# Patient Record
Sex: Female | Born: 1995 | Race: White | Hispanic: No | Smoking: Never smoker
Health system: Southern US, Community
[De-identification: ages and names within clinical notes are randomized; demographics above are authoritative.]

## PROBLEM LIST (undated history)

## (undated) DIAGNOSIS — E669 Obesity, unspecified: Secondary | ICD-10-CM

## (undated) DIAGNOSIS — E119 Type 2 diabetes mellitus without complications: Secondary | ICD-10-CM

## (undated) DIAGNOSIS — G43909 Migraine, unspecified, not intractable, without status migrainosus: Secondary | ICD-10-CM

## (undated) DIAGNOSIS — F99 Mental disorder, not otherwise specified: Secondary | ICD-10-CM

## (undated) DIAGNOSIS — R932 Abnormal findings on diagnostic imaging of liver and biliary tract: Secondary | ICD-10-CM

## (undated) DIAGNOSIS — E282 Polycystic ovarian syndrome: Secondary | ICD-10-CM

## (undated) HISTORY — DX: Abnormal findings on diagnostic imaging of liver and biliary tract: R93.2

## (undated) HISTORY — DX: Obesity, unspecified: E66.9

## (undated) HISTORY — DX: Polycystic ovarian syndrome: E28.2

## (undated) HISTORY — PX: TONSILLECTOMY: SUR1361

## (undated) HISTORY — PX: ADENOIDECTOMY: SUR15

## (undated) HISTORY — DX: Type 2 diabetes mellitus without complications: E11.9

## (undated) HISTORY — PX: FOOT SURGERY: SHX648

## (undated) HISTORY — DX: Migraine, unspecified, not intractable, without status migrainosus: G43.909

## (undated) HISTORY — DX: Mental disorder, not otherwise specified: F99

---
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
## 2014-06-09 ENCOUNTER — Ambulatory Visit (INDEPENDENT_AMBULATORY_CARE_PROVIDER_SITE_OTHER): Payer: Medicaid Other | Admitting: Women's Health

## 2014-06-09 ENCOUNTER — Encounter: Payer: Self-pay | Admitting: Women's Health

## 2014-06-09 VITALS — BP 136/70 | HR 100 | Wt 310.0 lb

## 2014-06-09 DIAGNOSIS — Z3202 Encounter for pregnancy test, result negative: Secondary | ICD-10-CM

## 2014-06-09 DIAGNOSIS — L298 Other pruritus: Secondary | ICD-10-CM

## 2014-06-09 DIAGNOSIS — N926 Irregular menstruation, unspecified: Secondary | ICD-10-CM

## 2014-06-09 DIAGNOSIS — L732 Hidradenitis suppurativa: Secondary | ICD-10-CM

## 2014-06-09 DIAGNOSIS — N898 Other specified noninflammatory disorders of vagina: Secondary | ICD-10-CM

## 2014-06-09 LAB — POCT WET PREP (WET MOUNT): Clue Cells Wet Prep Whiff POC: NEGATIVE

## 2014-06-09 LAB — POCT URINE PREGNANCY: PREG TEST UR: NEGATIVE

## 2014-06-09 MED ORDER — FLUCONAZOLE 150 MG PO TABS: 150.0000 mg | ORAL_TABLET | Freq: Once | ORAL | Status: DC

## 2014-06-09 NOTE — Patient Instructions (Addendum)
Dr. Margo AyeHall, [facility], Dr. Lodema HongSimpson  Hidradenitis . Loose, light clothing. Avoid heat, friction, shearing. Don't squeeze! . Wash clothes in perfume/dye free detergent . Gentle non-soap cleanser, wash gently w/ fingers (No cloth, loofah), can use antibacterial cleanser . Weight loss . Decrease/no dairy Hidradenitis Suppurativa, Sweat Gland Abscess Hidradenitis suppurativa is a long lasting (chronic), uncommon disease of the sweat glands. With this, boil-like lumps and scarring develop in the groin, some times under the arms (axillae), and under the breasts. It may also uncommonly occur behind the ears, in the crease of the buttocks, and around the genitals.  CAUSES  The cause is from a blocking of the sweat glands. They then become infected. It may cause drainage and odor. It is not contagious. So it cannot be given to someone else. It most often shows up in puberty (about 5910 to 20 years of age). But it may happen much later. It is similar to acne which is a disease of the sweat glands. This condition is slightly more common in African-Americans and women. SYMPTOMS   Hidradenitis usually starts as one or more red, tender, swellings in the groin or under the arms (axilla).  Over a period of hours to days the lesions get larger. They often open to the skin surface, draining clear to yellow-colored fluid.  The infected area heals with scarring. DIAGNOSIS  Your caregiver makes this diagnosis by looking at you. Sometimes cultures (growing germs on plates in the lab) may be taken. This is to see what germ (bacterium) is causing the infection.  TREATMENT   Topical germ killing medicine applied to the skin (antibiotics) are the treatment of choice. Antibiotics taken by mouth (systemic) are sometimes needed when the condition is getting worse or is severe.  Avoid tight-fitting clothing which traps moisture in.  Dirt does not cause hidradenitis and it is not caused by poor  hygiene.  Involved areas should be cleaned daily using an antibacterial soap. Some patients find that the liquid form of Lever 2000, applied to the involved areas as a lotion after bathing, can help reduce the odor related to this condition.  Sometimes surgery is needed to drain infected areas or remove scarred tissue. Removal of large amounts of tissue is used only in severe cases.  Birth control pills may be helpful.  Oral retinoids (vitamin A derivatives) for 6 to 12 months which are effective for acne may also help this condition.  Weight loss will improve but not cure hidradenitis. It is made worse by being overweight. But the condition is not caused by being overweight.  This condition is more common in people who have had acne.  It may become worse under stress. There is no medical cure for hidradenitis. It can be controlled, but not cured. The condition usually continues for years with periods of getting worse and getting better (remission). Document Released: 09/29/2003 Document Revised: 05/09/2011 Document Reviewed: 05/17/2013 [facility] Patient Information 2015 [place], [place]. This information is not intended to replace advice given to you by your health care provider. Make sure you discuss any questions you have with your health care provider.

## 2014-06-09 NOTE — Progress Notes (Signed)
Patient ID: Megan Blackwell Melgoza, female   DOB: 31-Jan-1996, 19 y.o.   MRN: [id number]   [facility]  Patient name: Megan Blackwell Carlton MRN [id number]  Date of birth: 31-Jan-1996  CC & HPI:  Megan Blackwell Grudzinski is a 19 y.o. Caucasian female presenting today for f/u COCs. She's on Sprintec for PCOS/period management and states she is doing great, has regular periods monthly, moods are much better than when she was on LoLoestrin. Was a couple of days late w/ period starting on last pack and it only last 1 day. Does have new sex partner, not using condoms. Did miss a pill, took 2 the next day. Cloudy nonodorous d/c x few months (before new sex partner), w/ vaginal itching. Needs new PCP as her was only a ped.   Pertinent History Reviewed:  Medical & Surgical Hx:   Past Medical History  Diagnosis Date  . Asthma   . Abnormal liver ultrasound   . Obesity    Past Surgical History  Procedure Laterality Date  . Foot surgery    . Tonsillectomy    . Adenoidectomy     Medications: Reviewed & Updated - see associated section Social History: Reviewed -  reports that she has been passively smoking.  She has never used smokeless tobacco.  Objective Findings:  Vitals: BP 136/70 mmHg  Pulse 100  Wt 310 lb (140.615 kg)  LMP 04/30/2014  Physical Examination: General appearance - alert, well appearing, and in no distress Pelvic - normal external genitalia, normal vagina and cervix. Scant amount thin white nonodorous d/c Extremities: hidradenitis bilateral thighs- pt states she also has under bilateral axilla  Wet prep: few yeast UPT: neg  Assessment & Plan:  A:   COC f/u  PCOS  Vaginal yeast  Hidradenitis P:  Continue sprintec  Will check gc/ct from urine  Rx diflucan for yeast/vaginitis   F/U 2859yr for physical, start paps at 20yo  Gave info on hidradenitis  Gave info on local PCPs    Marge DuncansBooker, Kimberly Randall CNM, [facility]WHNP-BC 06/09/2014 10:35 AM

## 2014-06-11 LAB — GC/CHLAMYDIA PROBE AMP
Chlamydia trachomatis, NAA: NEGATIVE
Neisseria gonorrhoeae by PCR: NEGATIVE

## 2014-07-01 ENCOUNTER — Encounter (HOSPITAL_COMMUNITY): Payer: Self-pay

## 2014-07-01 ENCOUNTER — Emergency Department (HOSPITAL_COMMUNITY)
Admission: EM | Admit: 2014-07-01 | Discharge: 2014-07-01 | Disposition: A | Discharge status: Home / Self Care | Payer: Medicaid Other | Attending: Emergency Medicine | Admitting: Emergency Medicine

## 2014-07-01 DIAGNOSIS — R591 Generalized enlarged lymph nodes: Secondary | ICD-10-CM | POA: Diagnosis not present

## 2014-07-01 DIAGNOSIS — H9209 Otalgia, unspecified ear: Secondary | ICD-10-CM | POA: Diagnosis not present

## 2014-07-01 DIAGNOSIS — E669 Obesity, unspecified: Secondary | ICD-10-CM | POA: Diagnosis not present

## 2014-07-01 DIAGNOSIS — J45909 Unspecified asthma, uncomplicated: Secondary | ICD-10-CM | POA: Insufficient documentation

## 2014-07-01 DIAGNOSIS — Z79899 Other long term (current) drug therapy: Secondary | ICD-10-CM | POA: Insufficient documentation

## 2014-07-01 DIAGNOSIS — M542 Cervicalgia: Secondary | ICD-10-CM | POA: Diagnosis present

## 2014-07-01 DIAGNOSIS — J029 Acute pharyngitis, unspecified: Secondary | ICD-10-CM | POA: Insufficient documentation

## 2014-07-01 LAB — RAPID STREP SCREEN (MED CTR MEBANE ONLY): Streptococcus, Group A Screen (Direct): NEGATIVE

## 2014-07-01 MED ORDER — ACETAMINOPHEN 500 MG PO TABS
1000.0000 mg | ORAL_TABLET | Freq: Once | ORAL | Status: AC
  Administered 2014-07-01: 1000 mg via ORAL
  Filled 2014-07-01: qty 2

## 2014-07-01 NOTE — Discharge Instructions (Signed)
Your strep test is negative. Please wash hands frequently. Increase fluids. Salt water gargles will be helpful. Use Chloraseptic spray before eating. Use 600mg  of ibuprofen every 6 hours for aching and soreness. See your Medicaid Access MD for follow up or recheck if not improving. Pharyngitis Pharyngitis is a sore throat (pharynx). There is redness, pain, and swelling of your throat. HOME CARE   Drink enough fluids to keep your pee (urine) clear or pale yellow.  Only take medicine as told by your doctor.  You may get sick again if you do not take medicine as told. Finish your medicines, even if you start to feel better.  Do not take aspirin.  Rest.  Rinse your mouth (gargle) with salt water ( tsp of salt per 1 qt of water) every 1-2 hours. This will help the pain.  If you are not at risk for choking, you can suck on hard candy or sore throat lozenges. GET HELP IF:  You have large, tender lumps on your neck.  You have a rash.  You cough up green, yellow-brown, or bloody spit. GET HELP RIGHT AWAY IF:   You have a stiff neck.  You drool or cannot swallow liquids.  You throw up (vomit) or are not able to keep medicine or liquids down.  You have very bad pain that does not go away with medicine.  You have problems breathing (not from a stuffy nose). MAKE SURE YOU:   Understand these instructions.  Will watch your condition.  Will get help right away if you are not doing well or get worse. Document Released: 08/03/2007 Document Revised: 12/05/2012 Document Reviewed: 10/22/2012 [facility] Patient Information 2015 [place]Care, [place]LLC. This information is not intended to replace advice given to you by your health care provider. Make sure you discuss any questions you have with your health care provider.  Salt Water Gargle This solution will help make your mouth and throat feel better. HOME CARE INSTRUCTIONS   Mix 1 teaspoon of salt in 8 ounces of warm water.  Gargle with this  solution as much or often as you need or as directed. Swish and gargle gently if you have any sores or wounds in your mouth.  Do not swallow this mixture. Document Released: 11/19/2003 Document Revised: 05/09/2011 Document Reviewed: 04/11/2008 [facility] Patient Information 2015 [place]ExitCare, [place]LLC. This information is not intended to replace advice given to you by your health care provider. Make sure you discuss any questions you have with your health care provider.

## 2014-07-01 NOTE — ED Notes (Signed)
Pt reports pain in left side of neck x 1 week.  Pts says hurts to swallow and move in certain ways.  Denies fever.

## 2014-07-01 NOTE — ED Provider Notes (Signed)
CSN: [id number]     Arrival date & time 07/01/14  1507 History   First MD Initiated Contact with Patient 07/01/14 1612     Chief Complaint  Patient presents with  . Neck Pain     (Consider location/radiation/quality/duration/timing/severity/associated sxs/prior Treatment) HPI Comments: 19 year old female with obesity PCO S history presents with sore throat and left-sided neck pain anterior discomfort for the past week. Worse or swelling. No dental pain. Patient tolerating oral, no fevers. Pain with palpation left side of anterior neck. No neck stiffness.  Patient is a 19 y.o. female presenting with neck pain. The history is provided by the patient.  Neck Pain Associated symptoms: no fever and no headaches     Past Medical History  Diagnosis Date  . Asthma   . Abnormal liver ultrasound   . Obesity    Past Surgical History  Procedure Laterality Date  . Foot surgery    . Tonsillectomy    . Adenoidectomy     Family History  Problem Relation Age of Onset  . Osteoporosis Mother   . Diabetes Maternal Grandmother   . Diabetes Maternal Grandfather    History  Substance Use Topics  . Smoking status: Passive Smoke Exposure - Never Smoker  . Smokeless tobacco: Never Used  . Alcohol Use: No   OB History    No data available     Review of Systems  Constitutional: Negative for fever and chills.  HENT: Positive for ear pain and sore throat.   Musculoskeletal: Positive for neck pain.  Neurological: Negative for headaches.      Allergies  Review of patient's allergies indicates no known allergies.  Home Medications   Prior to Admission medications   Medication Sig Start Date End Date Taking? Authorizing Provider  aspirin-acetaminophen-caffeine (EXCEDRIN MIGRAINE) [phone] MG per tablet Take 2 tablets by mouth every 6 (six) hours as needed for headache.   Yes Historical Provider, MD  Melatonin 5 MG TABS Take 10 mg by mouth at bedtime.    Yes Historical Provider, MD   norgestimate-ethinyl estradiol (SPRINTEC 28) 0.25-35 MG-MCG tablet Take 1 tablet by mouth daily. 10/09/13  Yes Cheral Marker, CNM  fluconazole (DIFLUCAN) 150 MG tablet Take 1 tablet (150 mg total) by mouth once. Take 1 pill now, may take 2nd pill in 3 days if needed Patient not taking: Reported on 07/01/2014 06/09/14   Cheral Marker, CNM  tobramycin (TOBREX) 0.3 % ophthalmic solution Place 2 drops into the left eye every 4 (four) hours. Patient not taking: Reported on 06/09/2014 12/24/13   Elson Areas, PA-C   BP 102/61 mmHg  Pulse 103  Temp(Src) 98.5 F (36.9 C) (Oral)  Resp 16  Ht 5' 4.5" (1.638 m)  Wt 302 lb 14.4 oz (137.395 kg)  BMI 51.21 kg/m2  SpO2 98%  LMP 06/26/2014 Physical Exam  Constitutional: She appears well-developed and well-nourished. No distress.  HENT:  Head: Normocephalic and atraumatic.  Patient has tender anterior cervical adenopathy bilateral worse in the left, no meningismus. No mild fluid behind left TM no drainage. trismus, uvular deviation, unilateral posterior pharyngeal edema or submandibular swelling.   Neck: Normal range of motion. Neck supple.  Nursing note and vitals reviewed.   ED Course  Procedures (including critical care time) Labs Review Labs Reviewed  RAPID STREP SCREEN  CULTURE, GROUP A STREP    Imaging Review No results found.   EKG Interpretation None      MDM   Final diagnoses:  Pharyngitis  Lymphadenopathy  Well-appearing female with cervical adenopathy and sore throat. Strep test pending. Tylenol for pain. Discussed supportive care and reasons to return. No red flags or emergent signs of infection.    Results and differential diagnosis were discussed with the patient/parent/guardian. Close follow up outpatient was discussed, comfortable with the plan.   Medications  acetaminophen (TYLENOL) tablet 1,000 mg (1,000 mg Oral Given 07/01/14 1638)    Filed Vitals:   07/01/14 1522 07/01/14 1753  BP: 127/67 102/61   Pulse: 112 103  Temp: 98.8 F (37.1 C) 98.5 F (36.9 C)  TempSrc: Oral Oral  Resp: 16 16  Height: 5' 4.5" (1.638 m)   Weight: 302 lb 14.4 oz (137.395 kg)   SpO2: 100% 98%    Final diagnoses:  Pharyngitis  Lymphadenopathy        Blane OharaJoshua Xzavian Semmel, MD 07/02/14 [phone]

## 2014-07-03 LAB — CULTURE, GROUP A STREP: STREP A CULTURE: POSITIVE — AB

## 2014-07-04 ENCOUNTER — Telehealth (HOSPITAL_BASED_OUTPATIENT_CLINIC_OR_DEPARTMENT_OTHER): Payer: Self-pay | Admitting: Emergency Medicine

## 2014-07-04 NOTE — Telephone Encounter (Signed)
Post ED Visit - Positive Culture Follow-up: Successful Patient Follow-Up  Culture assessed and recommendations reviewed by: []  Wes Dulaney, Pharm.D., BCPS [x]  Celedonio MiyamotoJeremy Frens, Pharm.D., BCPS []  Georgina PillionElizabeth Martin, [address] BCPS []  AvalonMinh Pham, [address]Pharm[address]D., BCPS, AAHIVP []  Estella HuskMichelle Turner, Pharm.D., BCPS, AAHIVP []  Red ChristiansSamson Lee, Pharm.D. []  Tennis Mustassie Stewart, Pharm.D.  Positive strep culture  [x]  Patient discharged without antimicrobial prescription and treatment is now indicated []  Organism is resistant to prescribed ED discharge antimicrobial []  Patient with positive blood cultures  Changes discussed with ED provider: Renne CriglerJoshua Geiple PA New antibiotic prescription Amoxicillin 500mg  po bid x 10 days Called to [facility] [place] [phone]  Contacted patient, 07/04/14 1039   Berle MullMiller, Hortencia Martire 07/04/2014, 10:38 AM

## 2014-07-04 NOTE — Progress Notes (Signed)
ED Antimicrobial Stewardship Positive Culture Follow Up   Megan Blackwell is an 19 y.o. female [facility] with a chief complaint of sore throat, ear pain, and neck pain.  Pt was afebrile, rapid strep test negative.  Pt was discharged w/ instructions to use acetaminophen for pain.  Culture now positive for Group A strep.  Chief Complaint  Patient presents with  . Neck Pain    Recent Results (from the past 720 hour(s))  GC/Chlamydia Probe Amp     Status: None   Collection Time: 06/09/14  3:09 PM  Result Value Ref Range Status   Chlamydia trachomatis, NAA Negative Negative Final   Neisseria gonorrhoeae by PCR Negative Negative Final   PLEASE NOTE: Comment  Final    Comment: Acceptable specimens for this test are female urethral swab, endocervical swab and liquid based pap specimens, vaginal swabs in APTIMA transports and first void urine. See online Directory of Services for test number for rectal and pharyngeal specimens.   Rapid strep screen     Status: None   Collection Time: 07/01/14  4:32 PM  Result Value Ref Range Status   Streptococcus, Group A Screen (Direct) NEGATIVE NEGATIVE Final    Comment: (NOTE) A Rapid Antigen test may result negative if the antigen level in the sample is below the detection level of this test. The FDA has not cleared this test as a stand-alone test therefore the rapid antigen negative result has reflexed to a Group A Strep culture.   Culture, Group A Strep     Status: Abnormal   Collection Time: 07/01/14  4:32 PM  Result Value Ref Range Status   Strep A Culture Positive (A)  Corrected    Comment: (NOTE) Penicillin and ampicillin are drugs of choice for treatment of beta-hemolytic streptococcal infections. Susceptibility testing of penicillins and other beta-lactam agents approved by the FDA for treatment of beta-hemolytic streptococcal infections need not be performed routinely because nonsusceptible isolates are  extremely rare in any beta-hemolytic streptococcus and have not been reported for Streptococcus pyogenes (group A). (CLSI 2011) Performed At: [facility] [address] [place], [place] [id number] Mila HomerHancock William F MD WU:[id number]:[phone] CORRECTED ON 05/05 AT 1341: PREVIOUSLY REPORTED AS Comment     [x]  Patient discharged originally without antimicrobial agent and treatment is now indicated  New antibiotic prescription: Amoxicillin 500mg  po BID x 10 days  ED Provider: Renne CriglerJoshua Geiple, PA-C   Megan Blackwell Kirtland BouchardK 07/04/2014, 9:33 AM Infectious Diseases Pharmacist Phone# [phone]

## 2014-09-19 ENCOUNTER — Telehealth: Payer: Self-pay | Admitting: Women's Health

## 2014-09-19 ENCOUNTER — Other Ambulatory Visit: Payer: Self-pay | Admitting: Women's Health

## 2014-09-22 NOTE — Telephone Encounter (Signed)
Pt needed to get a refill on her birth control. Pt's mother advised to call pharmacy later this afternoon. Pt's mother verbalized understanding. Joellyn Haff sent Rx refill to pt's pharmacy.

## 2014-12-22 ENCOUNTER — Emergency Department (HOSPITAL_COMMUNITY)
Admission: EM | Admit: 2014-12-22 | Discharge: 2014-12-22 | Disposition: A | Discharge status: Home / Self Care | Payer: Medicaid Other | Attending: Emergency Medicine | Admitting: Emergency Medicine

## 2014-12-22 ENCOUNTER — Encounter (HOSPITAL_COMMUNITY): Payer: Self-pay | Admitting: Emergency Medicine

## 2014-12-22 DIAGNOSIS — J45909 Unspecified asthma, uncomplicated: Secondary | ICD-10-CM | POA: Insufficient documentation

## 2014-12-22 DIAGNOSIS — Z3202 Encounter for pregnancy test, result negative: Secondary | ICD-10-CM | POA: Diagnosis not present

## 2014-12-22 DIAGNOSIS — E669 Obesity, unspecified: Secondary | ICD-10-CM | POA: Insufficient documentation

## 2014-12-22 DIAGNOSIS — Z79899 Other long term (current) drug therapy: Secondary | ICD-10-CM | POA: Diagnosis not present

## 2014-12-22 DIAGNOSIS — R112 Nausea with vomiting, unspecified: Secondary | ICD-10-CM | POA: Insufficient documentation

## 2014-12-22 DIAGNOSIS — R05 Cough: Secondary | ICD-10-CM | POA: Insufficient documentation

## 2014-12-22 DIAGNOSIS — N390 Urinary tract infection, site not specified: Secondary | ICD-10-CM

## 2014-12-22 DIAGNOSIS — R197 Diarrhea, unspecified: Secondary | ICD-10-CM | POA: Diagnosis not present

## 2014-12-22 LAB — URINALYSIS, ROUTINE W REFLEX MICROSCOPIC
Glucose, UA: NEGATIVE mg/dL
KETONES UR: NEGATIVE mg/dL
NITRITE: NEGATIVE
Protein, ur: 100 mg/dL — AB
UROBILINOGEN UA: 1 mg/dL (ref 0.0–1.0)
pH: 5.5 (ref 5.0–8.0)

## 2014-12-22 LAB — POC URINE PREG, ED: Preg Test, Ur: NEGATIVE

## 2014-12-22 LAB — URINE MICROSCOPIC-ADD ON

## 2014-12-22 MED ORDER — ONDANSETRON 8 MG PO TBDP: ORAL_TABLET | ORAL | Status: DC

## 2014-12-22 MED ORDER — CEPHALEXIN 500 MG PO CAPS: 500.0000 mg | ORAL_CAPSULE | Freq: Four times a day (QID) | ORAL | Status: DC

## 2014-12-22 MED ORDER — ONDANSETRON 8 MG PO TBDP
8.0000 mg | ORAL_TABLET | Freq: Once | ORAL | Status: AC
  Administered 2014-12-22: 8 mg via ORAL
  Filled 2014-12-22: qty 1

## 2014-12-22 MED ORDER — IBUPROFEN 800 MG PO TABS
800.0000 mg | ORAL_TABLET | Freq: Once | ORAL | Status: AC
  Administered 2014-12-22: 800 mg via ORAL
  Filled 2014-12-22: qty 1

## 2014-12-22 NOTE — ED Provider Notes (Signed)
CSN: [id number]     Arrival date & time 12/22/14  1653 History   First MD Initiated Contact with Patient 12/22/14 1715     Chief Complaint  Patient presents with  . Emesis     Patient is a 19 y.o. female presenting with vomiting. The history is provided by the patient.  Emesis Severity:  Moderate Duration:  7 days Timing:  Intermittent Progression:  Improving Chronicity:  New Relieved by:  Nothing Worsened by:  Nothing tried Associated symptoms: chills and diarrhea   Risk factors: no travel to endemic areas   pt presents for multiple complaints She reports intermittent vomiting for 7 days She reports fever intermittently, tmax 104 2 days ago She has had diarrhea She reports ear pain and dental pain She reports mild abd discomfort No HA She reports mild cough She also reports mild vaginal bleeding as well  Past Medical History  Diagnosis Date  . Asthma   . Abnormal liver ultrasound   . Obesity    Past Surgical History  Procedure Laterality Date  . Foot surgery    . Tonsillectomy    . Adenoidectomy     Family History  Problem Relation Age of Onset  . Osteoporosis Mother   . Diabetes Maternal Grandmother   . Diabetes Maternal Grandfather    Social History  Substance Use Topics  . Smoking status: Passive Smoke Exposure - Never Smoker  . Smokeless tobacco: Never Used  . Alcohol Use: No   OB History    No data available     Review of Systems  Constitutional: Positive for chills.  Respiratory: Positive for cough.   Gastrointestinal: Positive for vomiting and diarrhea.  Genitourinary: Positive for vaginal bleeding. Negative for vaginal discharge.  All other systems reviewed and are negative.     Allergies  Review of patient's allergies indicates no known allergies.  Home Medications   Prior to Admission medications   Medication Sig Start Date End Date Taking? Authorizing Provider  acetaminophen (TYLENOL) 500 MG tablet Take 500 mg by mouth every 6  (six) hours as needed for mild pain or fever.   Yes Historical Provider, MD  albuterol (PROVENTIL HFA;VENTOLIN HFA) 108 (90 BASE) MCG/ACT inhaler Inhale 1-2 puffs into the lungs every 6 (six) hours as needed for wheezing or shortness of breath.   Yes Historical Provider, MD  Melatonin 5 MG TABS Take 10 mg by mouth at bedtime.    Yes Historical Provider, MD  SPRINTEC 28 0.25-35 MG-MCG tablet TAKE 1 TABLET BY MOUTH EVERY DAY 09/22/14  Yes Cheral MarkerKimberly R Booker, CNM   BP 127/81 mmHg  Pulse 120  Temp(Src) 98.4 F (36.9 C) (Oral)  Resp 18  Ht 5\' 4"  (1.626 m)  Wt 303 lb 14.4 oz (137.848 kg)  BMI 52.14 kg/m2  SpO2 99%  LMP 12/10/2014 Physical Exam CONSTITUTIONAL: Well developed/well nourished HEAD: Normocephalic/atraumatic EYES: EOMI/PERRL ENMT: Mucous membranes moist, no dental abscess noted, no trismus noted, bilateral TM clear NECK: supple no meningeal signs SPINE/BACK:entire spine nontender CV: S1/S2 noted, no murmurs/rubs/gallops noted LUNGS: Lungs are clear to auscultation bilaterally, no apparent distress ABDOMEN: soft, nontender, no rebound or guarding, bowel sounds noted throughout abdomen GU:no cva tenderness NEURO: Pt is awake/alert/appropriate, moves all extremitiesx4.  No facial droop.   EXTREMITIES: pulses normal/equal, full ROM SKIN: warm, color normal PSYCH: no abnormalities of mood noted, alert and oriented to situation  ED Course  Procedures  6:12 PM Pt well appearing, no distress noted Suspect viral illness at this time  6:59 PM uti noted Pt taking PO She is well appearing Will d/c home Discussed strict return precautions BP 124/87 mmHg  Pulse 106  Temp(Src) 98.5 F (36.9 C) (Oral)  Resp 18  Ht  (1.626 m)  Wt 303 lb 14.4 oz (137.848 kg)  BMI 52.14 kg/m2  SpO2 95%  LMP 12/10/2014  Labs Review Labs Reviewed  URINALYSIS, ROUTINE W REFLEX MICROSCOPIC (NOT AT [facility]) - Abnormal; Notable for the following:    Color, Urine AMBER (*)    APPearance HAZY (*)     Specific Gravity, Urine >1.030 (*)    Hgb urine dipstick LARGE (*)    Bilirubin Urine MODERATE (*)    Protein, ur 100 (*)    Leukocytes, UA MODERATE (*)    All other components within normal limits  URINE MICROSCOPIC-ADD ON - Abnormal; Notable for the following:    Squamous Epithelial / LPF FEW (*)    Bacteria, UA MANY (*)    All other components within normal limits  POC URINE PREG, ED    I have personally reviewed and evaluated these lab results as part of my medical decision-making.    MDM   Final diagnoses:  None    Nursing notes including past medical history and social history reviewed and considered in documentation Labs/vital reviewed myself and considered during evaluation     Zadie Rhine, MD 12/22/14 1900

## 2014-12-22 NOTE — ED Notes (Signed)
After triage pt says she has discomfort to left abdomen when she lays down.

## 2014-12-22 NOTE — ED Notes (Signed)
Vomiting for last 7 days.  Fever on and off for 7 days, current temp 98.4.  Vomited one time in last 24 hours.

## 2014-12-22 NOTE — ED Notes (Signed)
Patient given discharge instruction, verbalized understand. Patient ambulatory out of the department.  

## 2014-12-30 IMAGING — CT CT ABD-PELV W/O CM
2 of 4 series · 17 of 46 positions shown, 19 images · non-contrast
Comparison: None.

CLINICAL DATA: Left flank pain, rule out kidney stone

EXAM:
CT ABDOMEN AND PELVIS WITHOUT CONTRAST
TECHNIQUE: Multidetector CT imaging of the abdomen and pelvis was performed
following the standard protocol without intravenous contrast.

[Series 2: standard/full over (age)lbs 5.0 · axial · 0.84mm/px · z∈[-528,-73]mm · 14 of 99 slices shown, 16 images]
[im 4/99  soft-tissue]
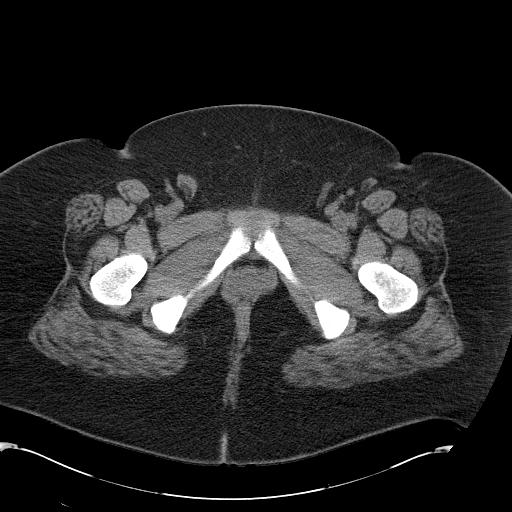
[im 4/99  bone]
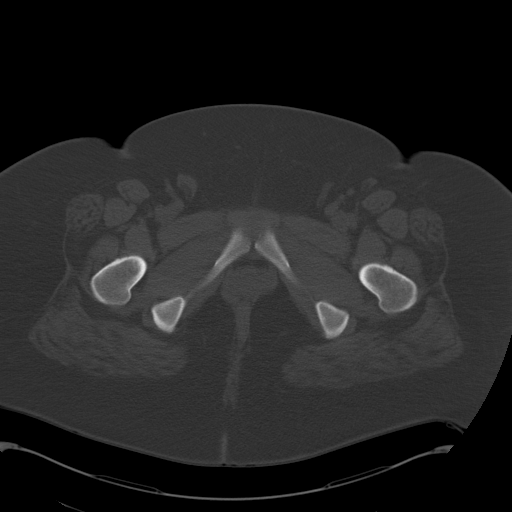
[im 11/99  soft-tissue]
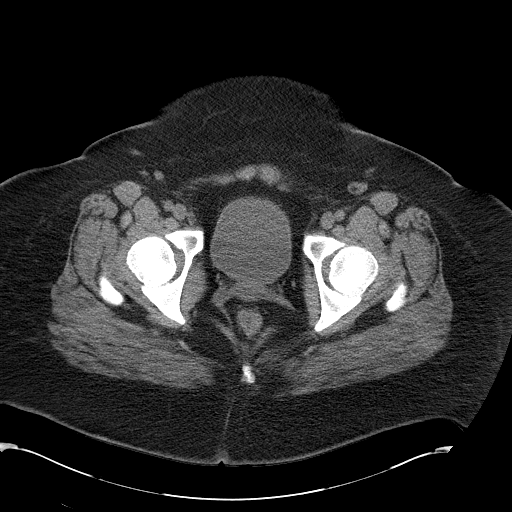
[im 18/99  soft-tissue]
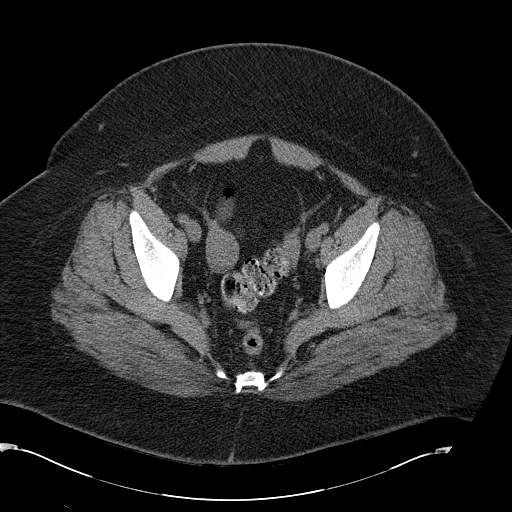
[im 25/99  soft-tissue]
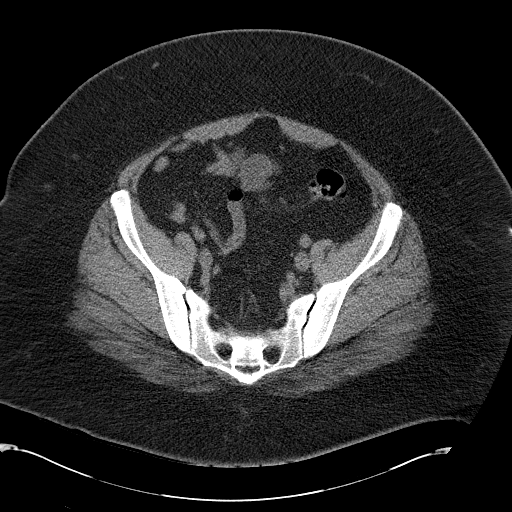
[im 32/99  soft-tissue]
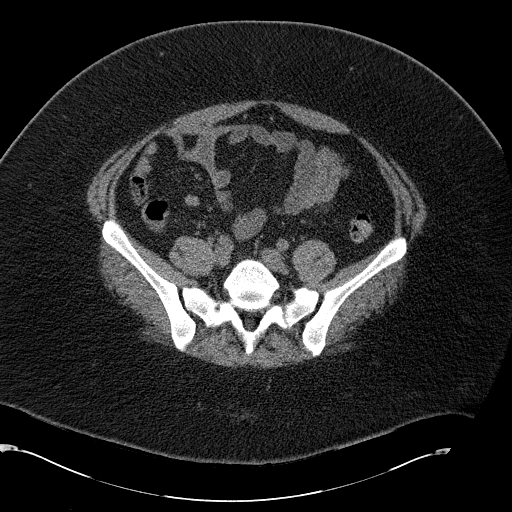
[im 39/99  soft-tissue]
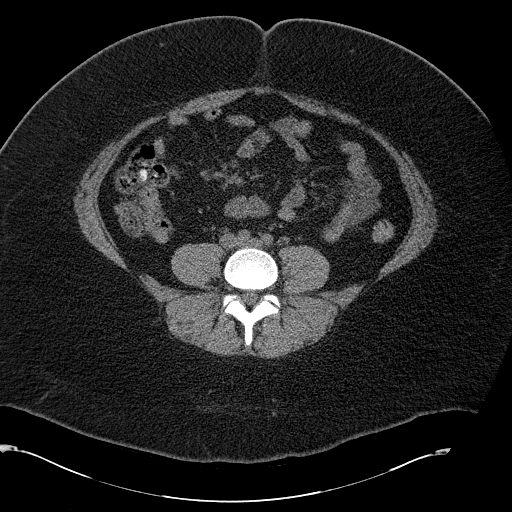
[im 46/99  soft-tissue]
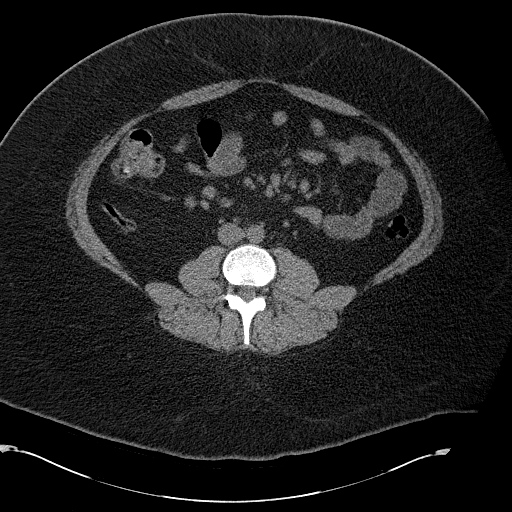
[im 53/99  soft-tissue]
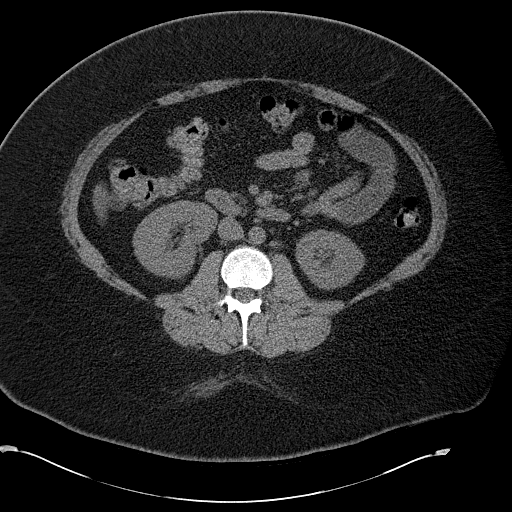
[im 60/99  soft-tissue]
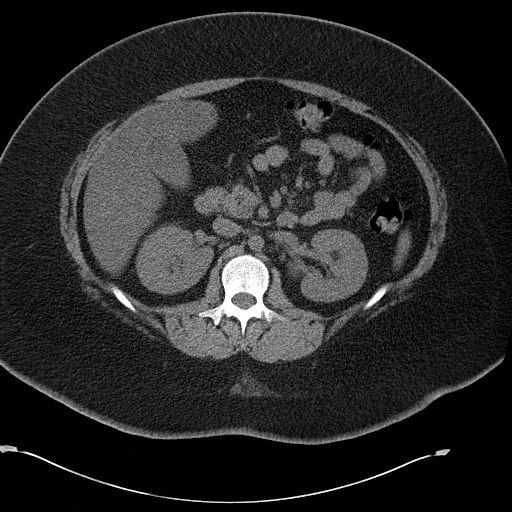
[im 60/99  bone]
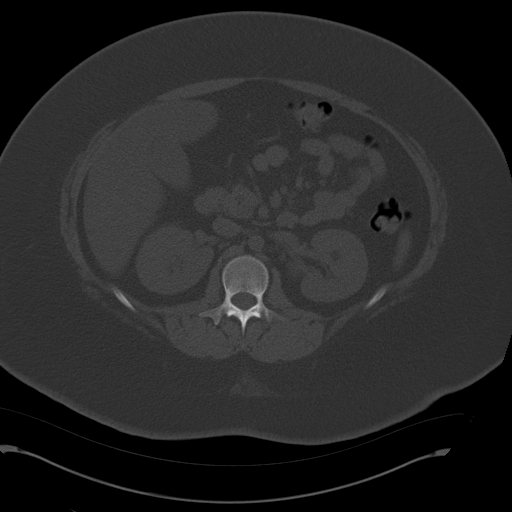
[im 67/99  soft-tissue]
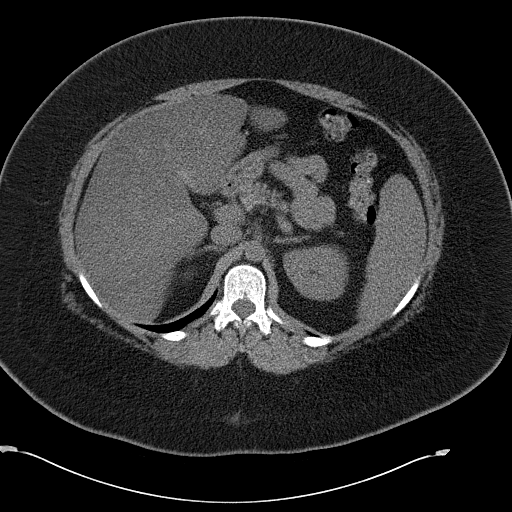
[im 74/99  soft-tissue]
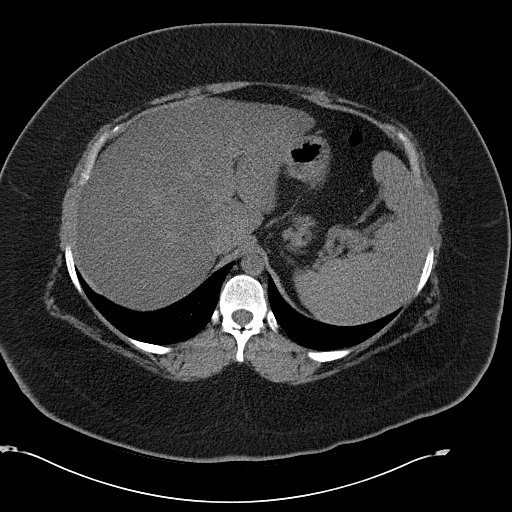
[im 81/99  soft-tissue]
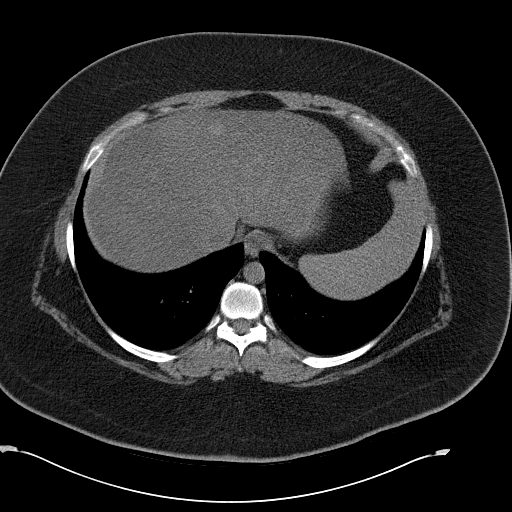
[im 88/99  soft-tissue]
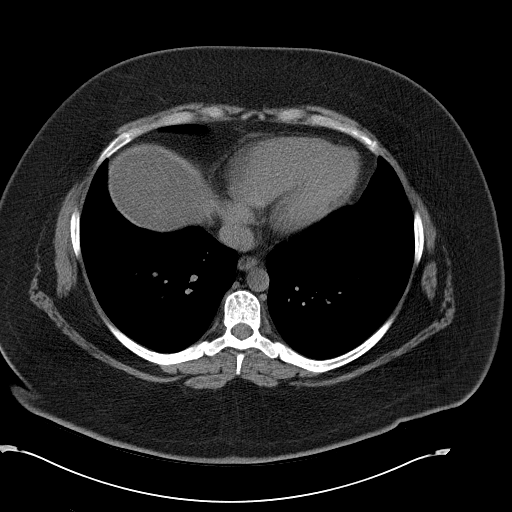
[im 95/99  soft-tissue]
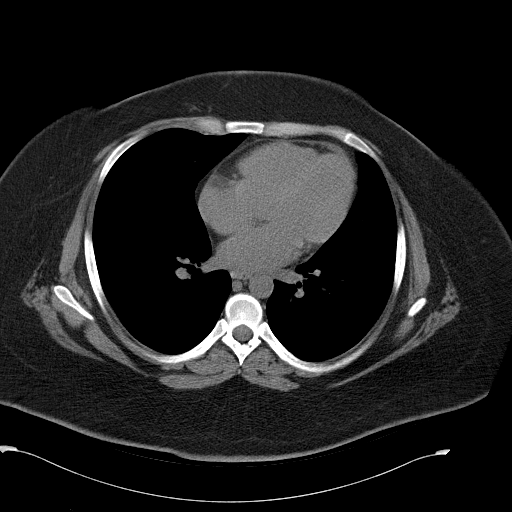

[Series 4: mpr coronal · coronal · 0.87mm/px · 3 of 96 slices shown]
[im 32/96  soft-tissue]
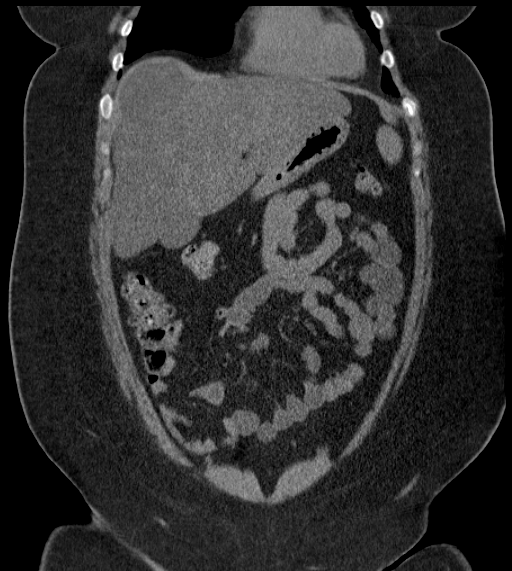
[im 43/96  soft-tissue]
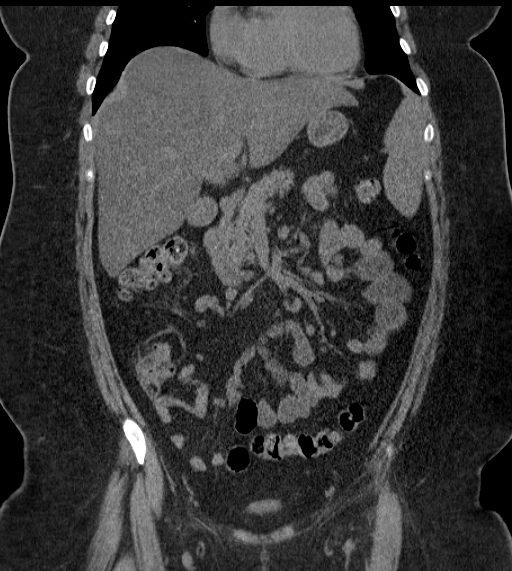
[im 53/96  soft-tissue]
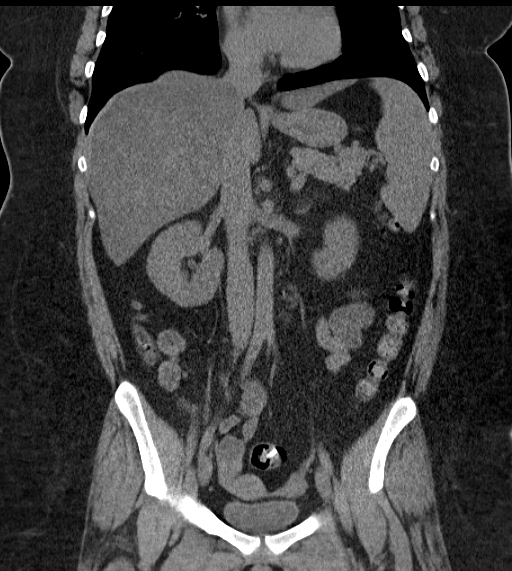

[17 of 46 positions shown; findings below may reference images not displayed]

FINDINGS: Lung bases are clear.

Extensive fatty infiltration of liver. 2 cm area of intermediate
density in the right lobe liver and of the in the dome of the liver
may represent fatty sparing. Gallbladder and bile ducts are normal.
Pancreas and spleen are normal.

Kidneys are normal without obstruction, mass, or stone. Urinary
bladder is normal. Uterus and adnexae are normal.

Mild sigmoid diverticulosis. No bowel obstruction or bowel edema.
Appendix is normal.

Negative for mass or adenopathy. No free fluid. No acute bony
change.
IMPRESSION: Fatty infiltration of liver with probable area of fatty sparing in
the right lobe.

No renal calculi or obstruction identified.

## 2015-07-20 ENCOUNTER — Encounter: Payer: Self-pay | Admitting: Adult Health

## 2015-07-20 ENCOUNTER — Ambulatory Visit: Payer: Medicaid Other | Admitting: Adult Health

## 2015-07-29 ENCOUNTER — Ambulatory Visit (INDEPENDENT_AMBULATORY_CARE_PROVIDER_SITE_OTHER): Payer: Medicaid Other | Admitting: Women's Health

## 2015-07-29 ENCOUNTER — Encounter: Payer: Self-pay | Admitting: Women's Health

## 2015-07-29 VITALS — BP 118/60 | HR 104 | Ht 65.5 in | Wt 325.5 lb

## 2015-07-29 DIAGNOSIS — Z01419 Encounter for gynecological examination (general) (routine) without abnormal findings: Secondary | ICD-10-CM

## 2015-07-29 DIAGNOSIS — Z3202 Encounter for pregnancy test, result negative: Secondary | ICD-10-CM

## 2015-07-29 DIAGNOSIS — Z308 Encounter for other contraceptive management: Secondary | ICD-10-CM | POA: Diagnosis not present

## 2015-07-29 DIAGNOSIS — Z113 Encounter for screening for infections with a predominantly sexual mode of transmission: Secondary | ICD-10-CM

## 2015-07-29 LAB — POCT URINE PREGNANCY: PREG TEST UR: NEGATIVE

## 2015-07-29 NOTE — Patient Instructions (Signed)
Hidradenitis . Loose, light clothing. Avoid heat, friction, shearing. Don't squeeze! . Wash clothes in perfume/dye free detergent . Gentle non-soap cleanser, wash gently w/ fingers (No cloth, loofah), can use antibacterial cleanser . NO smoking . Weight loss . Decrease/no dairy Hidradenitis Suppurativa Hidradenitis suppurativa is a long-term (chronic) skin disease that starts with blocked sweat glands or hair follicles. Bacteria may grow in these blocked openings of your skin. Hidradenitis suppurativa is like a severe form of acne that develops in areas of your body where acne would be unusual. It is most likely to affect the areas of your body where skin rubs against skin and becomes moist. This includes your:  Underarms.  Groin.  Genital areas.  Buttocks.  Upper thighs.  Breasts. Hidradenitis suppurativa may start out with small pimples. The pimples can develop into deep sores that break open (rupture) and drain pus. Over time your skin may thicken and become scarred. Hidradenitis suppurativa cannot be passed from person to person.  CAUSES  The exact cause of hidradenitis suppurativa is not known. This condition may be due to:  Female and female hormones. The condition is rare before and after puberty.  An overactive body defense system (immune system). Your immune system may overreact to the blocked hair follicles or sweat glands and cause swelling and pus-filled sores. RISK FACTORS You may have a higher risk of hidradenitis suppurativa if you:  Are a woman.  Are between ages 2411 and 5855.  Have a family history of hidradenitis suppurativa.  Have a personal history of acne.  Are overweight.  Smoke.  Take the drug lithium. SIGNS AND SYMPTOMS  The first signs of an outbreak are usually painful skin bumps that look like pimples. As the condition progresses:  Skin bumps may get bigger and grow deeper into the skin.  Bumps under the skin may rupture and drain smelly  pus.  Skin may become itchy and infected.  Skin may thicken and scar.  Drainage may continue through tunnels under the skin (fistulas).  Walking and moving your arms can become painful. DIAGNOSIS  Your health care provider may diagnose hidradenitis suppurativa based on your medical history and your signs and symptoms. A physical exam will also be done. You may need to see a health care provider who specializes in skin diseases (dermatologist). You may also have tests done to confirm the diagnosis. These can include:  Swabbing a sample of pus or drainage from your skin so it can be sent to the lab and tested for infection.  Blood tests to check for infection. TREATMENT  The same treatment will not work for everybody with hidradenitis suppurativa. Your treatment will depend on how severe your symptoms are. You may need to try several treatments to find what works best for you. Part of your treatment may include cleaning and bandaging (dressing) your wounds. You may also have to take medicines, such as the following:  Antibiotics.  Acne medicines.  Medicines to block or suppress the immune system.  A diabetes medicine (metformin) is sometimes used to treat this condition.  For women, birth control pills can sometimes help relieve symptoms. You may need surgery if you have a severe case of hidradenitis suppurativa that does not respond to medicine. Surgery may involve:   Using a laser to clear the skin and remove hair follicles.  Opening and draining deep sores.  Removing the areas of skin that are diseased and scarred. HOME CARE INSTRUCTIONS  Learn as much as you can about your  disease, and work closely with your health care providers.  Take medicines only as directed by your health care provider.  If you were prescribed an antibiotic medicine, finish it all even if you start to feel better.  If you are overweight, losing weight may be very helpful. Try to reach and maintain a  healthy weight.  Do not use any tobacco products, including cigarettes, chewing tobacco, or electronic cigarettes. If you need help quitting, ask your health care provider.  Do not shave the areas where you get hidradenitis suppurativa.  Do not wear deodorant.  Wear loose-fitting clothes.  Try not to overheat and get sweaty.  Take a daily bleach bath as directed by your health care provider.  Fill your bathtub halfway with water.  Pour in  cup of unscented household bleach.  Soak for 5-10 minutes.  Cover sore areas with a warm, clean washcloth (compress) for 5-10 minutes. SEEK MEDICAL CARE IF:   You have a flare-up of hidradenitis suppurativa.  You have chills or a fever.  You are having trouble controlling your symptoms at home.   This information is not intended to replace advice given to you by your health care provider. Make sure you discuss any questions you have with your health care provider.   Document Released: 09/29/2003 Document Revised: 03/07/2014 Document Reviewed: 05/17/2013 Elsevier Interactive Patient Education [employer]

## 2015-07-29 NOTE — Progress Notes (Addendum)
Patient ID: Megan LundborgShyanne M Hoppel, female   DOB: Dec 29, 1995, 20 y.o.   MRN: [id number] Subjective:   Megan Blackwell is a 20 y.o. G0 Caucasian female here for a routine well-woman exam.  Patient's last menstrual period was 07/14/2015.   PCOS, on Sprintec, doing well, has periods, forgets pills occasionally. Contemplating pregnancy, wants to discuss further w/ partner Current complaints: some LBP, occ pain in RUQ- not r/t eating that she's aware of PCP: none        Social History: Sexual: heterosexual Marital Status: dating Living situation: w/ grandparents Occupation: criminal justice @ [employer] Tobacco/alcohol: no tobacco or etoh Illicit drugs: no history of illicit drug use  The following portions of the patient's history were reviewed and updated as appropriate: allergies, current medications, past family history, past medical history, past social history, past surgical history and problem list.  Past Medical History Past Medical History  Diagnosis Date  . Asthma   . Abnormal liver ultrasound   . Obesity   . Migraines     Past Surgical History Past Surgical History  Procedure Laterality Date  . Foot surgery    . Tonsillectomy    . Adenoidectomy      Gynecologic History No obstetric history on file.  Patient's last menstrual period was 07/14/2015. Contraception: OCP (estrogen/progesterone) Last Pap: <21yo. Results were: n/a Last mammogram: never. Results were: n/a Last TCS: never  Obstetric History OB History  No data available    Current Medications Current Outpatient Prescriptions on File Prior to Visit  Medication Sig Dispense Refill  . albuterol (PROVENTIL HFA;VENTOLIN HFA) 108 (90 BASE) MCG/ACT inhaler Inhale 1-2 puffs into the lungs every 6 (six) hours as needed for wheezing or shortness of breath.    . Melatonin 5 MG TABS Take 10 mg by mouth at bedtime.     . SPRINTEC 28 0.25-35 MG-MCG tablet TAKE 1 TABLET BY MOUTH EVERY DAY 28 tablet 11   No current  facility-administered medications on file prior to visit.    Review of Systems Patient denies any headaches, blurred vision, shortness of breath, chest pain, abdominal pain, problems with bowel movements, urination, or intercourse.  Objective:  BP 118/60 mmHg  Pulse 104  Ht 5' 5.5" (1.664 m)  Wt 325 lb 8 oz (147.646 kg)  BMI 53.32 kg/m2  LMP 07/14/2015 Physical Exam  General:  Well developed, well nourished, no acute distress. She is alert and oriented x3. Skin:  Warm and dry, multiple healed hidradenitis scars under bilateral axilla and inner thighs. 1 small active area Rt axilla- no s/s infection Neck:  Midline trachea, no thyromegaly or nodules Cardiovascular: Regular rate and rhythm, no murmur heard Lungs:  Effort normal, all lung fields clear to auscultation bilaterally Breasts:  No dominant palpable mass, retraction, or nipple discharge Abdomen:  Soft, non tender, no hepatosplenomegaly or masses Pelvic:  External genitalia is normal in appearance.  The vagina is normal in appearance. The cervix is bulbous, no CMT.  Thin prep pap is not done <21yo. Unable to palpate uterus/ovaries/adnexa adequately d/t body habitus. No tenderness.  Extremities:  No swelling or varicosities noted Psych:  She has a normal mood and affect  Neg urine preg test today  Assessment:   Healthy well-woman exam Obesity PCOS Hidradenitis Occ RUQ pain  Plan:  Continue sprintec, runs out in July F/U 89yr for pap & physical, or sooner if needed Call for appt sooner if decides wants to get pregnant Monitor RUQ pain, if continues/worsens let us know Mammogram @  20yo or sooner if problems Colonoscopy  or sooner if problems  Marge Duncans CNM, [facility] 07/29/2015 3:17 PM

## 2015-07-30 LAB — RPR: RPR Ser Ql: NONREACTIVE

## 2015-07-30 LAB — GC/CHLAMYDIA PROBE AMP
CHLAMYDIA, DNA PROBE: NEGATIVE
NEISSERIA GONORRHOEAE BY PCR: NEGATIVE

## 2015-07-30 LAB — HIV ANTIBODY (ROUTINE TESTING W REFLEX): HIV SCREEN 4TH GENERATION: NONREACTIVE

## 2015-12-03 ENCOUNTER — Telehealth: Payer: Self-pay | Admitting: *Deleted

## 2015-12-03 ENCOUNTER — Telehealth: Payer: Self-pay | Admitting: Women's Health

## 2015-12-03 NOTE — Telephone Encounter (Signed)
Pt states would like to get medication for infertility. Spoke with Shawna ClampKimberly Booker, CNM and stated pt need an appt to discuss. Pt states she had Family Planning MCD and did not know if her insurance would cover this appt. Called transferred to front staff to discuss.

## 2016-07-07 ENCOUNTER — Encounter: Payer: Self-pay | Admitting: Women's Health

## 2016-07-07 ENCOUNTER — Other Ambulatory Visit (HOSPITAL_COMMUNITY)
Admission: RE | Admit: 2016-07-07 | Discharge: 2016-07-07 | Disposition: A | Discharge status: Home / Self Care | Payer: Medicaid Other | Source: Ambulatory Visit | Attending: Obstetrics & Gynecology | Admitting: Obstetrics & Gynecology

## 2016-07-07 ENCOUNTER — Ambulatory Visit (INDEPENDENT_AMBULATORY_CARE_PROVIDER_SITE_OTHER): Payer: Medicaid Other | Admitting: Women's Health

## 2016-07-07 VITALS — BP 124/68 | HR 93 | Ht 65.5 in | Wt 325.5 lb

## 2016-07-07 DIAGNOSIS — Z3009 Encounter for other general counseling and advice on contraception: Secondary | ICD-10-CM

## 2016-07-07 DIAGNOSIS — Z01419 Encounter for gynecological examination (general) (routine) without abnormal findings: Secondary | ICD-10-CM | POA: Insufficient documentation

## 2016-07-07 DIAGNOSIS — Z308 Encounter for other contraceptive management: Secondary | ICD-10-CM | POA: Diagnosis not present

## 2016-07-07 DIAGNOSIS — Z113 Encounter for screening for infections with a predominantly sexual mode of transmission: Secondary | ICD-10-CM

## 2016-07-07 NOTE — Progress Notes (Signed)
Subjective:   Megan LundborgShyanne M Blackwell is a 21 y.o. 690P0000 Caucasian female here for a routine well-woman exam.  Patient's last menstrual period was 07/01/2016 (exact date).    Current complaints: intermittent RUQ pain/pressure x 1 year- not associated w/ eating, seems to get worse if she sits down or lays on her Lt side. Normal bm's 1-3x/day, diarrhea only if her stomach is upset, no constipation. Denies uti s/s. Some odor to d/c after sex, no itching/irritation.  Had abnormal liver u/s 06/04/13 at ED visit: Diffusely echogenic liver compatible w/ fatty infiltration; hypoechoic heterogenous lesion Rt lobe measures 2.6x1.8x1.5cm which showed some interval growth since prior study- recommended MRI of liver w/ & w/o contrast for further eval. Pt never f/u d/t insurance.  PCOS: stopped coc's last July in attempts to get pregnant. Periods are irregular. Normal period 2/5-2/9 w/ cramping, then light period 5/4-5/7 w/ cramping. Not taking pnv.  PCP: none       Does desire labs, other than STD [facility]  Social History: Sexual: heterosexual Marital Status: engaged Living situation: with grandparents Occupation: unemployed Tobacco/alcohol: no tobacco, etoh: rarely Illicit drugs: no history of illicit drug use  The following portions of the patient's history were reviewed and updated as appropriate: allergies, current medications, past family history, past medical history, past social history, past surgical history and problem list.  Past Medical History Past Medical History:  Diagnosis Date  . Abnormal liver ultrasound   . Asthma   . Migraines   . Obesity     Past Surgical History Past Surgical History:  Procedure Laterality Date  . ADENOIDECTOMY    . FOOT SURGERY    . TONSILLECTOMY      Gynecologic History G0P0000  Patient's last menstrual period was 07/01/2016 (exact date). Contraception: none Last Pap: never. Results were: n/a Last mammogram: never. Results were: n/a Last TCS:  never  Obstetric History OB History  Gravida Para Term Preterm AB Living  0 0 0 0 0 0  SAB TAB Ectopic Multiple Live Births  0 0 0 0 0        Current Medications Current Outpatient Prescriptions on File Prior to Visit  Medication Sig Dispense Refill  . albuterol (PROVENTIL HFA;VENTOLIN HFA) 108 (90 BASE) MCG/ACT inhaler Inhale 1-2 puffs into the lungs every 6 (six) hours as needed for wheezing or shortness of breath.    . Melatonin 5 MG TABS Take 10 mg by mouth as needed.      No current facility-administered medications on file prior to visit.     Review of Systems Patient denies any headaches, blurred vision, shortness of breath, chest pain, abdominal pain, problems with bowel movements, urination, or intercourse.  Objective:  BP 124/68 (BP Location: Left Arm, Patient Position: Sitting, Cuff Size: Large)   Pulse 93   Ht 5' 5.5" (1.664 m)   Wt (!) 325 lb 8 oz (147.6 kg)   LMP 07/01/2016 (Exact Date)   BMI 53.34 kg/m  Physical Exam  General:  Well developed, well nourished, no acute distress. She is alert and oriented x3. Skin:  Warm and dry; hidradenitis scars until bilateral axilla/breasts/inner thighs Neck:  Midline trachea, no thyromegaly or nodules Cardiovascular: Regular rate and rhythm, no murmur heard Lungs:  Effort normal, all lung fields clear to auscultation bilaterally Breasts:  No dominant palpable mass, retraction, or nipple discharge Abdomen:  Soft, non tender, no hepatosplenomegaly or masses Pelvic:  External genitalia is normal in appearance.  The vagina is normal in appearance.  The cervix is bulbous, no CMT.  Thin prep pap is done w/ reflex HR HPV cotesting. Uterus is felt to be normal size, shape, and contour.  No adnexal masses or tenderness noted. Extremities:  No swelling or varicosities noted Psych:  She has a normal mood and affect  Assessment:   Healthy well-woman [facility] exam STD screen Obesity PCOS Hidradenitis  Intermittent RUQ w/ h/o  abnormal liver u/s  Plan:  GC/CT from pap, HIV, RPR today Discussed getting CMP, but not covered by [facility] and pt would have to pay out of pocket- pt declines at this time Will see who she needs to contact to get set up w/ [facility] program to do MRI of liver w/&w/o contrast per previous recommendations- I will call pt back Advised weight loss- to help w/ PCOS/fertility/hidradenitis, to decrease carbs/calories, increase exercise Begin pnv daily F/U 62yr for physical, or sooner if needed Mammogram @21yo  or sooner if problems Colonoscopy @21yo  or sooner if problems  Marge Duncans CNM, WHNP-BC 07/07/2016 2:10 PM

## 2016-07-07 NOTE — Patient Instructions (Signed)
Weight loss can help reverse the symptoms of PCOS and can help with pregnancy. It can also help with the hidradenitis.  Decrease carbohydrates (bread, rice, pasta, potatoes, sweets, candy, soda) and calories Increase exercise (can do inside)  Start taking a prenatal vitamin daily (at least 400mcg folic acid) If you are trying to get pregnant:   Have sex every other day on days 7-24 of your cycle (Day 1 is the 1st day of your period)  [place] before sex  Lay with your hips elevated on pillows for 20-5930mins after sex  Do not smoke or drink alcohol  Lose weight if you are overweight  Take a prenatal vitamin with at least 400mcg of folic acid  Decrease stress in your life  For Him:   Wear boxers instead of briefs  Avoid hot baths/jacuzzi  Vit C supplement  Do not smoke or drink alcohol  Lose weight if you are overweight  Keep in mind that it can be normal to take up to a year to become pregnant 80-90% of women will become pregnant within 1 year of trying  Hidradenitis . Loose, light clothing. Avoid heat, friction, shearing. Don't squeeze! . Wash clothes in perfume/dye free detergent . Gentle non-soap cleanser, wash gently w/ fingers (No cloth, loofah), can use antibacterial cleanser . Stop smoking! . Weight loss . Decrease/no dairy  Hidradenitis Suppurativa Hidradenitis suppurativa is a long-term (chronic) skin disease that starts with blocked sweat glands or hair follicles. Bacteria may grow in these blocked openings of your skin. Hidradenitis suppurativa is like a severe form of acne that develops in areas of your body where acne would be unusual. It is most likely to affect the areas of your body where skin rubs against skin and becomes moist. This includes your:  Underarms.  Groin.  Genital areas.  Buttocks.  Upper thighs.  Breasts. Hidradenitis suppurativa may start out with small pimples. The pimples can develop into deep sores that break open (rupture) and  drain pus. Over time your skin may thicken and become scarred. Hidradenitis suppurativa cannot be passed from person to person. What are the causes? The exact cause of hidradenitis suppurativa is not known. This condition may be due to:  Female and female hormones. The condition is rare before and after puberty.  An overactive body defense system (immune system). Your immune system may overreact to the blocked hair follicles or sweat glands and cause swelling and pus-filled sores. What increases the risk? You may have a higher risk of hidradenitis suppurativa if you:  Are a woman.  Are between ages 2011 and 5455.  Have a family history of hidradenitis suppurativa.  Have a personal history of acne.  Are overweight.  Smoke.  Take the drug lithium. What are the signs or symptoms? The first signs of an outbreak are usually painful skin bumps that look like pimples. As the condition progresses:  Skin bumps may get bigger and grow deeper into the skin.  Bumps under the skin may rupture and drain smelly pus.  Skin may become itchy and infected.  Skin may thicken and scar.  Drainage may continue through tunnels under the skin (fistulas).  Walking and moving your arms can become painful. How is this diagnosed? Your health care provider may diagnose hidradenitis suppurativa based on your medical history and your signs and symptoms. A physical exam will also be done. You may need to see a health care provider who specializes in skin diseases (dermatologist). You may also have tests done to  confirm the diagnosis. These can include:  Swabbing a sample of pus or drainage from your skin so it can be sent to the lab and tested for infection.  Blood tests to check for infection. How is this treated? The same treatment will not work for everybody with hidradenitis suppurativa. Your treatment will depend on how severe your symptoms are. You may need to try several treatments to find what works  best for you. Part of your treatment may include cleaning and bandaging (dressing) your wounds. You may also have to take medicines, such as the following:  Antibiotics.  Acne medicines.  Medicines to block or suppress the immune system.  A diabetes medicine (metformin) is sometimes used to treat this condition.  For women, birth control pills can sometimes help relieve symptoms. You may need surgery if you have a severe case of hidradenitis suppurativa that does not respond to medicine. Surgery may involve:  Using a laser to clear the skin and remove hair follicles.  Opening and draining deep sores.  Removing the areas of skin that are diseased and scarred. Follow these instructions at home:  Learn as much as you can about your disease, and work closely with your health care providers.  Take medicines only as directed by your health care provider.  If you were prescribed an antibiotic medicine, finish it all even if you start to feel better.  If you are overweight, losing weight may be very helpful. Try to reach and maintain a healthy weight.  Do not use any tobacco products, including cigarettes, chewing tobacco, or electronic cigarettes. If you need help quitting, ask your health care provider.  Do not shave the areas where you get hidradenitis suppurativa.  Do not wear deodorant.  Wear loose-fitting clothes.  Try not to overheat and get sweaty.  Take a daily bleach bath as directed by your health care provider.  Fill your bathtub halfway with water.  Pour in  cup of unscented household bleach.  Soak for 5-10 minutes.  Cover sore areas with a warm, clean washcloth (compress) for 5-10 minutes. Contact a health care provider if:  You have a flare-up of hidradenitis suppurativa.  You have chills or a fever.  You are having trouble controlling your symptoms at home. This information is not intended to replace advice given to you by your health care provider. Make  sure you discuss any questions you have with your health care provider. Document Released: 09/29/2003 Document Revised: 07/23/2015 Document Reviewed: 05/17/2013 Elsevier Interactive Patient Education  2017 [employer].

## 2016-07-08 LAB — RPR: RPR Ser Ql: NONREACTIVE

## 2016-07-08 LAB — HIV ANTIBODY (ROUTINE TESTING W REFLEX): HIV SCREEN 4TH GENERATION: NONREACTIVE

## 2016-07-12 ENCOUNTER — Telehealth: Payer: Self-pay | Admitting: Women's Health

## 2016-07-12 LAB — CYTOLOGY - PAP
Chlamydia: NEGATIVE
DIAGNOSIS: NEGATIVE
Neisseria Gonorrhea: NEGATIVE

## 2016-07-12 NOTE — Telephone Encounter (Signed)
Called pt, notified of normal pap and labs and that I spoke w/ [facility]grid at [facility], [facility] department about [facility] so we can get her scheduled to do her MRI. She needs to go to [facility] to pick up information to apply. They are there Tues and Friday from 8-4:15. She can also try applying for regular medicaid, instead of Family Planning. Pt to let me know when she is ready and I will order MRI of liver/upper abd.  Cheral MarkerKimberly R. Keyon Winnick, CNM, WHNP-BC 07/12/2016 2:15 PM

## 2017-11-29 ENCOUNTER — Ambulatory Visit (INDEPENDENT_AMBULATORY_CARE_PROVIDER_SITE_OTHER): Payer: Medicaid Other | Admitting: Women's Health

## 2017-11-29 ENCOUNTER — Encounter: Payer: Self-pay | Admitting: Women's Health

## 2017-11-29 VITALS — BP 110/80 | HR 85 | Ht 64.0 in | Wt 340.0 lb

## 2017-11-29 DIAGNOSIS — Z309 Encounter for contraceptive management, unspecified: Secondary | ICD-10-CM | POA: Diagnosis not present

## 2017-11-29 DIAGNOSIS — Z3009 Encounter for other general counseling and advice on contraception: Secondary | ICD-10-CM

## 2017-11-29 DIAGNOSIS — Z113 Encounter for screening for infections with a predominantly sexual mode of transmission: Secondary | ICD-10-CM

## 2017-11-29 DIAGNOSIS — Z01419 Encounter for gynecological examination (general) (routine) without abnormal findings: Secondary | ICD-10-CM

## 2017-11-29 LAB — POCT URINE PREGNANCY: Preg Test, Ur: NEGATIVE

## 2017-11-29 MED ORDER — METFORMIN HCL 500 MG PO TABS: 500.0000 mg | ORAL_TABLET | Freq: Two times a day (BID) | ORAL | 3 refills | Status: DC

## 2017-11-29 NOTE — Progress Notes (Addendum)
   WELL-WOMAN EXAMINATION Patient name: Megan Blackwell MRN [id number]  Date of birth: September 21, 1995 Chief Complaint:   Annual Exam  History of Present Illness:   Megan Blackwell is a 22 y.o. G0P0000 Caucasian female being seen today for a routine well-woman exam. Has PCOS, had period 4/23-5/1, 8/26-31, then 9/25-29, all have been light, doesn't have to wear pad. Had neg HPT Sept 2. Has been having some nausea and breast tenderness. Has had unprotected sex x 2+years, partner wants to see if she can get on clomid, she wants things to happen naturally.  Current complaints: none  PCP: none      Patient's last menstrual period was 11/22/2017. The current method of family planning is none Last pap 07/07/16. Results were: normal Last mammogram: never. Results were: n/a Last colonoscopy: never. Results were: n/a  Review of Systems:   Pertinent items are noted in HPI Denies any headaches, blurred vision, fatigue, shortness of breath, chest pain, abdominal pain, abnormal vaginal discharge/itching/odor/irritation, problems with periods, bowel movements, urination, or intercourse unless otherwise stated above. Pertinent History Reviewed:  Reviewed past medical,surgical, social and family history.  Reviewed problem list, medications and allergies. Physical Assessment:   Vitals:   11/29/17 1102  BP: 110/80  Pulse: 85  Weight: (!) 340 lb (154.2 kg)  Height: 5\' 4"  (1.626 m)  Body mass index is 58.36 kg/m.        Physical Examination:   General appearance - well appearing, and in no distress  Mental status - alert, oriented to person, place, and time  Psych:  She has a normal mood and affect  Skin - warm and dry, normal color, no suspicious lesions noted  Chest - effort normal, all lung fields clear to auscultation bilaterally  Heart - normal rate and regular rhythm  Neck:  midline trachea, no thyromegaly or nodules  Breasts - breasts appear normal, no suspicious masses, no skin or nipple changes  or  axillary nodes  Abdomen - soft, nontender, nondistended, no masses or organomegaly  Pelvic - VULVA: normal appearing vulva with no masses, tenderness or lesions  VAGINA: normal appearing vagina with normal color and discharge, no lesions  CERVIX: normal appearing cervix without discharge or lesions, no CMT  Thin prep pap is not done  UTERUS: nontender, exam limited d/t body habitus  ADNEXA: Nontender, exam limited d/t body habitus  Extremities:  No swelling or varicosities noted  Results for orders placed or performed in visit on 11/29/17 (from the past 24 hour(s))  POCT urine pregnancy   Collection Time: 11/29/17 12:10 PM  Result Value Ref Range   Preg Test, Ur Negative Negative    Assessment & Plan:  1) Well-Woman Exam  2) PCOS> wants to conceive, periods have become more regular last couple of months, take pnv, rx metformin 500mg  BID (to [facility], $4), if periods become irregular again can try COCs x , then metformin and clomid (knows will have to pay out of pocket for clomid)  3) BMI 58> has gained 15lbs since last year, recommend at least 10% weight loss  Labs/procedures today: gc/ct, hiv, rpr  Mammogram @22yo  or sooner if problems Colonoscopy @22yo  or sooner if problems  Orders Placed This Encounter  Procedures  . GC/Chlamydia Probe Amp  . HIV Antibody (routine testing w rflx)  . RPR  . POCT urine pregnancy    Follow-up: Return in about 1 year (around 11/30/2018) for Physical.  Cheral Marker CNM, WHNP-BC 11/29/2017 12:12 PM

## 2017-11-29 NOTE — Patient Instructions (Signed)
Let me know when you start bleeding again

## 2017-11-30 LAB — HIV ANTIBODY (ROUTINE TESTING W REFLEX): HIV SCREEN 4TH GENERATION: NONREACTIVE

## 2017-11-30 LAB — RPR: RPR Ser Ql: NONREACTIVE

## 2017-12-01 LAB — GC/CHLAMYDIA PROBE AMP
Chlamydia trachomatis, NAA: NEGATIVE
NEISSERIA GONORRHOEAE BY PCR: NEGATIVE

## 2019-01-15 ENCOUNTER — Other Ambulatory Visit: Payer: Self-pay | Admitting: Women's Health

## 2019-02-06 ENCOUNTER — Other Ambulatory Visit: Payer: Self-pay | Admitting: Women's Health

## 2019-03-07 ENCOUNTER — Other Ambulatory Visit: Payer: Self-pay | Admitting: Obstetrics & Gynecology

## 2019-03-07 ENCOUNTER — Other Ambulatory Visit: Payer: Self-pay | Admitting: Adult Health

## 2019-03-14 ENCOUNTER — Other Ambulatory Visit: Payer: Self-pay | Admitting: Adult Health

## 2019-04-25 ENCOUNTER — Other Ambulatory Visit: Payer: Self-pay

## 2019-04-25 ENCOUNTER — Other Ambulatory Visit (HOSPITAL_COMMUNITY)
Admission: RE | Admit: 2019-04-25 | Discharge: 2019-04-25 | Disposition: A | Discharge status: Home / Self Care | Payer: Medicaid Other | Source: Ambulatory Visit | Attending: Obstetrics & Gynecology | Admitting: Obstetrics & Gynecology

## 2019-04-25 ENCOUNTER — Encounter: Payer: Self-pay | Admitting: Adult Health

## 2019-04-25 ENCOUNTER — Ambulatory Visit (INDEPENDENT_AMBULATORY_CARE_PROVIDER_SITE_OTHER): Payer: Medicaid Other | Admitting: Adult Health

## 2019-04-25 VITALS — BP 124/77 | HR 104 | Ht 64.5 in | Wt 328.6 lb

## 2019-04-25 DIAGNOSIS — Z3009 Encounter for other general counseling and advice on contraception: Secondary | ICD-10-CM

## 2019-04-25 DIAGNOSIS — Z124 Encounter for screening for malignant neoplasm of cervix: Secondary | ICD-10-CM | POA: Insufficient documentation

## 2019-04-25 DIAGNOSIS — N921 Excessive and frequent menstruation with irregular cycle: Secondary | ICD-10-CM

## 2019-04-25 DIAGNOSIS — Z01419 Encounter for gynecological examination (general) (routine) without abnormal findings: Secondary | ICD-10-CM

## 2019-04-25 DIAGNOSIS — Z0000 Encounter for general adult medical examination without abnormal findings: Secondary | ICD-10-CM

## 2019-04-25 DIAGNOSIS — Z8742 Personal history of other diseases of the female genital tract: Secondary | ICD-10-CM

## 2019-04-25 DIAGNOSIS — Z113 Encounter for screening for infections with a predominantly sexual mode of transmission: Secondary | ICD-10-CM | POA: Diagnosis not present

## 2019-04-25 DIAGNOSIS — L732 Hidradenitis suppurativa: Secondary | ICD-10-CM

## 2019-04-25 NOTE — Progress Notes (Signed)
Patient ID: Megan Blackwell, female   DOB: [birth year], 24 y.o.   MRN: [id number] History of Present Illness:  Megan Blackwell is a 24 year old white female, engaged, G0P0 in for a well woman gyn exam and pap.   Current Medications, Allergies, Past Medical History, Past Surgical History, Family History and Social History were reviewed in [employer] record.     Review of Systems: Patient denies any headaches, hearing loss, fatigue, blurred vision,  chest pain, abdominal pain, problems with bowel movements, urination, or intercourse. No joint pain or mood swings. Periods heavy at times and may last 3 weeks , has PCO Short of breath at times    Physical Exam:BP 124/77 (BP Location: Left Arm, Patient Position: Sitting, Cuff Size: Large)   Pulse (!) 104   Ht 5' 4.5" (1.638 m)   Wt (!) 328 lb 9.6 oz (149.1 kg)   LMP 04/23/2019 (Exact Date)   BMI 55.53 kg/m  General:  Well developed, well nourished, no acute distress Skin:  Warm and dry, has hidradenitis underarms breasts and in groin area  Neck:  Midline trachea, normal thyroid, good ROM, no lymphadenopathy Lungs; Clear to auscultation bilaterally Breast:  No dominant palpable mass, retraction, or nipple discharge Cardiovascular: Regular rate and rhythm Abdomen:  Soft, non tender, no hepatosplenomegaly,obese Pelvic:  External genitalia is normal in appearance. The vagina is normal in appearance,+blood. Urethra has no lesions or masses. The cervix is smooth, pap with GC/CHL and reflex HPV performed.  Uterus is felt to be normal size, shape, and contour.  No adnexal masses or tenderness noted.Bladder is non tender, no masses felt.Difficlut exam due to abdominal girth Extremities/musculoskeletal:  No swelling or varicosities noted, no clubbing or cyanosis Psych:  No mood changes, alert and cooperative,seems happy Fall risk is low PHQ 9 score is 11, denies being SI, or HI just blue with some anxiety, declines meds. Examination  chaperoned by Stoney Bang LPN  Impression and Plan:  1. Routine cervical smear Pap sent  2. Encounter for gynecological examination with Papanicolaou smear of cervix Pap sent Physical in 1 year Pap in 3 if normal  3. Family planning Check HIV and RPR  4. Screening examination for STD (sexually transmitted disease) Check HIV and RPR  5. Menorrhagia with irregular cycle She declines OCs or magace So take OTC iron or MV with iron  She declines HGB check   6. History of PCOS She is aware she will probably need clomid to get pregnant   7. Hidradenitis

## 2019-04-26 LAB — RPR: RPR Ser Ql: NONREACTIVE

## 2019-04-26 LAB — HIV ANTIBODY (ROUTINE TESTING W REFLEX): HIV Screen 4th Generation wRfx: NONREACTIVE

## 2019-04-29 LAB — CYTOLOGY - PAP
Chlamydia: NEGATIVE
Comment: NEGATIVE
Comment: NORMAL
Diagnosis: NEGATIVE
Neisseria Gonorrhea: NEGATIVE

## 2020-05-25 ENCOUNTER — Encounter: Payer: Self-pay | Admitting: Adult Health

## 2020-05-25 ENCOUNTER — Other Ambulatory Visit (HOSPITAL_COMMUNITY)
Admission: RE | Admit: 2020-05-25 | Discharge: 2020-05-25 | Disposition: A | Discharge status: Home / Self Care | Payer: Medicaid Other | Source: Ambulatory Visit | Attending: Adult Health | Admitting: Adult Health

## 2020-05-25 ENCOUNTER — Ambulatory Visit (INDEPENDENT_AMBULATORY_CARE_PROVIDER_SITE_OTHER): Payer: Medicaid Other | Admitting: Adult Health

## 2020-05-25 ENCOUNTER — Other Ambulatory Visit: Payer: Self-pay

## 2020-05-25 VITALS — BP 121/81 | HR 88 | Ht 65.75 in | Wt 305.0 lb

## 2020-05-25 DIAGNOSIS — N921 Excessive and frequent menstruation with irregular cycle: Secondary | ICD-10-CM

## 2020-05-25 DIAGNOSIS — Z8742 Personal history of other diseases of the female genital tract: Secondary | ICD-10-CM

## 2020-05-25 DIAGNOSIS — Z3009 Encounter for other general counseling and advice on contraception: Secondary | ICD-10-CM | POA: Diagnosis not present

## 2020-05-25 DIAGNOSIS — F419 Anxiety disorder, unspecified: Secondary | ICD-10-CM

## 2020-05-25 DIAGNOSIS — Z113 Encounter for screening for infections with a predominantly sexual mode of transmission: Secondary | ICD-10-CM | POA: Diagnosis not present

## 2020-05-25 DIAGNOSIS — L732 Hidradenitis suppurativa: Secondary | ICD-10-CM

## 2020-05-25 DIAGNOSIS — Z01419 Encounter for gynecological examination (general) (routine) without abnormal findings: Secondary | ICD-10-CM | POA: Diagnosis not present

## 2020-05-25 DIAGNOSIS — F439 Reaction to severe stress, unspecified: Secondary | ICD-10-CM

## 2020-05-25 DIAGNOSIS — F32A Depression, unspecified: Secondary | ICD-10-CM | POA: Insufficient documentation

## 2020-05-25 MED ORDER — ESCITALOPRAM OXALATE 10 MG PO TABS: 10.0000 mg | ORAL_TABLET | Freq: Every day | ORAL | 2 refills | Status: DC

## 2020-05-25 NOTE — Progress Notes (Signed)
Patient ID: Megan Blackwell, female   DOB: 11/18/1995, 25 y.o.   MRN: [id number] History of Present Illness: Lichelle is a 25 year old white female,single, G0P0, in for well woman gyn exam,she had normal pap 04/25/19. She has skipped a period for this month and usually does about every other month then may be heavy, but does not wants birth control to regulate. +stress, Grand pa is dying and her dad had leg amputated after accident.  Current Medications, Allergies, Past Medical History, Past Surgical History, Family History and Social History were reviewed in [employer] record.     Review of Systems: Patient denies any headaches, hearing loss, fatigue, blurred vision, shortness of breath, chest pain, abdominal pain, problems with bowel movements, urination, or intercourse(not active). No joint pain or mood swings. See HPI for positives.   Physical Exam:BP 121/81 (BP Location: Left Arm, Patient Position: Sitting, Cuff Size: Large)   Pulse 88   Ht 5' 5.75" (1.67 m)   Wt (!) 305 lb (138.3 kg)   LMP 03/09/2020   BMI 49.60 kg/m  General:  Well developed, well nourished, no acute distress Skin:  Warm and dry,keloid left ear  Neck:  Midline trachea, normal thyroid, good ROM, no lymphadenopathy Lungs; Clear to auscultation bilaterally Breast:  No dominant palpable mass, retraction, or nipple discharge Cardiovascular: Regular rate and rhythm Abdomen:  Soft, non tender, no hepatosplenomegaly Pelvic:  External genitalia is normal in appearance, has scarring from hidradenitis. The vagina is normal in appearance. Urethra has no lesions or masses. The cervix is smooth, CV swab obtained.  Uterus is felt to be normal size, shape, and contour.  No adnexal masses or tenderness noted.Bladder is non tender, no masses felt. Extremities/musculoskeletal:  No swelling or varicosities noted, no clubbing or cyanosis Psych:  No mood changes, alert and cooperative,seems sad. AA is 2 Fall risk  is low PHQ 9 score is 9 GAD 7 score is 7  [facility] - 05/25/20 1043      Pregnancy Intention Screening   Does the patient want to become pregnant in the next year? Ok Either Way    Does the patient's partner want to become pregnant in the next year? N/A    Would the patient like to discuss contraceptive options today? No      Contraception Wrap Up   Current Method Abstinence    End Method Abstinence    Contraception Counseling Provided No         Examination chaperoned by Marylu Lund LPN  Impression and Plan 1. Encounter for well woman exam with routine gynecological exam Physical in 1 year Pap in 2024  2. Family planning CV swab sent  Check HIV and RPR  3. Screening examination for STD (sexually transmitted disease) CV swab sent for GC/CHL and trich Check HIV and RPR  4. History of PCOS  5. Menorrhagia with irregular cycle She declines birth control  6. Hidradenitis Keep clean and dry  7. Stress at home   8. Depression, unspecified depression type Will try lexapro Meds ordered this encounter  Medications  . escitalopram (LEXAPRO) 10 MG tablet    Sig: Take 1 tablet (10 mg total) by mouth daily.    Dispense:  30 tablet    Refill:  2    Order Specific Question:   Supervising Provider    Answer:   Lazaro Arms [[id number]]  Call me with follow up   9. Anxiety Will try lexapro

## 2020-05-26 LAB — CERVICOVAGINAL ANCILLARY ONLY
Chlamydia: NEGATIVE
Comment: NEGATIVE
Comment: NEGATIVE
Comment: NORMAL
Neisseria Gonorrhea: NEGATIVE
Trichomonas: NEGATIVE

## 2020-05-26 LAB — HIV ANTIBODY (ROUTINE TESTING W REFLEX): HIV Screen 4th Generation wRfx: NONREACTIVE

## 2020-05-27 LAB — RPR: RPR Ser Ql: NONREACTIVE

## 2022-01-24 ENCOUNTER — Other Ambulatory Visit (HOSPITAL_COMMUNITY)
Admission: RE | Admit: 2022-01-24 | Discharge: 2022-01-24 | Disposition: A | Discharge status: Home / Self Care | Payer: Medicaid Other | Source: Ambulatory Visit | Attending: Adult Health | Admitting: Adult Health

## 2022-01-24 ENCOUNTER — Encounter: Payer: Self-pay | Admitting: Adult Health

## 2022-01-24 ENCOUNTER — Ambulatory Visit (INDEPENDENT_AMBULATORY_CARE_PROVIDER_SITE_OTHER): Payer: Medicaid Other | Admitting: Adult Health

## 2022-01-24 VITALS — BP 124/73 | HR 77 | Ht 65.0 in | Wt 289.0 lb

## 2022-01-24 DIAGNOSIS — Z3009 Encounter for other general counseling and advice on contraception: Secondary | ICD-10-CM | POA: Insufficient documentation

## 2022-01-24 DIAGNOSIS — Z0000 Encounter for general adult medical examination without abnormal findings: Secondary | ICD-10-CM | POA: Insufficient documentation

## 2022-01-24 DIAGNOSIS — L732 Hidradenitis suppurativa: Secondary | ICD-10-CM

## 2022-01-24 DIAGNOSIS — Z01419 Encounter for gynecological examination (general) (routine) without abnormal findings: Secondary | ICD-10-CM | POA: Diagnosis not present

## 2022-01-24 DIAGNOSIS — Z113 Encounter for screening for infections with a predominantly sexual mode of transmission: Secondary | ICD-10-CM | POA: Diagnosis not present

## 2022-01-24 DIAGNOSIS — Z8742 Personal history of other diseases of the female genital tract: Secondary | ICD-10-CM

## 2022-01-24 DIAGNOSIS — Z3202 Encounter for pregnancy test, result negative: Secondary | ICD-10-CM | POA: Diagnosis not present

## 2022-01-24 LAB — POCT URINE PREGNANCY: Preg Test, Ur: NEGATIVE

## 2022-01-24 NOTE — Progress Notes (Signed)
Patient ID: Megan Blackwell, female   DOB: Jul 11, 1995, 26 y.o.   MRN: [id number] History of Present Illness: Heer is a 26 year old white female,single, G0P0, in for well woman gyn exam and pap. Missed period. Has Family Planning Medicaid. She is working at [employer]    Current Medications, Allergies, Past Medical History, Past Surgical History, Family History and Social History were reviewed in [employer] record.     Review of Systems: Patient denies any headaches, hearing loss, fatigue, blurred vision, shortness of breath, chest pain, abdominal pain, problems with bowel movements, urination, or intercourse.(Not active). No joint pain or mood swings. Missed period Has numbness side left thigh for 2 years, started when worked at [employer]    Physical Exam:BP 124/73 (BP Location: Left Arm, Patient Position: Sitting, Cuff Size: Normal)   Pulse 77   Ht 5\' 5"  (1.651 m)   Wt 289 lb (131.1 kg)   LMP 12/15/2021   BMI 48.09 kg/m  UPT is negative. Has lost about 16 lbs since last year  General:  Well developed, well nourished, no acute distress Skin:  Warm and dry Neck:  Midline trachea, normal thyroid, good ROM, no lymphadenopathy Lungs; Clear to auscultation bilaterally Breast:  No dominant palpable mass, retraction, or nipple discharge Cardiovascular: Regular rate and rhythm Abdomen:  Soft, non tender, no hepatosplenomegaly Pelvic:  External genitalia is normal in appearance, has scarring from HS The vagina is normal in appearance. Urethra has no lesions or masses. The cervix is smooth, pap with GC/CHL and HR HPV genotyping performed.  Uterus is felt to be normal size, shape, and contour.  No adnexal masses or tenderness noted.Bladder is non tender, no masses felt. Rectal: Deferred  Extremities/musculoskeletal:  No swelling or varicosities noted, no clubbing or cyanosis,has numbness left outer thigh,normal temperature of skin  Psych:  No mood changes, alert and  cooperative,seems happy AA is 0 Fall risk is low    01/24/2022    3:36 PM 05/25/2020   10:44 AM 04/25/2019    2:45 PM  Depression screen PHQ 2/9  Decreased Interest 1 1 1   Down, Depressed, Hopeless 1 1 2   PHQ - 2 Score 2 2 3   Altered sleeping 2 2 2   Tired, decreased energy 1 1 2   Change in appetite 0 3 1  Feeling bad or failure about yourself  1 1 2   Trouble concentrating 0 0 0  Moving slowly or fidgety/restless 0 0 0  Suicidal thoughts 0 0 1  PHQ-9 Score 6 9 11   Difficult doing work/chores   Somewhat difficult       01/24/2022    3:37 PM 05/25/2020   10:44 AM  GAD 7 : Generalized Anxiety Score  Nervous, Anxious, on Edge 1 2  Control/stop worrying 1 1  Worry too much - different things 1 1  Trouble relaxing 0 0  Restless 0 0  Easily annoyed or irritable 1 3  Afraid - awful might happen 0 0  Total GAD 7 Score 4 7      Upstream - 01/24/22 1529       Pregnancy Intention Screening   Does the patient want to become pregnant in the next year? Ok Either Way    Does the patient's partner want to become pregnant in the next year? Ok Either Way    Would the patient like to discuss contraceptive options today? No      Contraception Wrap Up   Current Method Abstinence  End Method Abstinence    Contraception Counseling Provided No            Examination chaperoned by Malachy Mood LPN  Impression and Plan: 1. Routine general medical examination at a health care facility Pap sent Pap in 3 years if normal Physical in 1 year - Cytology - PAP( [place])  2. Family planning Pap sent  - Cytology - PAP( )  3. Pregnancy examination or test, negative result - POCT urine pregnancy  4. Screening examination for STD (sexually transmitted disease) Check labs has family planning medicaid  - Hepatitis C antibody - HIV Antibody (routine testing w rflx) - RPR - Hepatitis B surface antigen  5. Encounter for gynecological examination with Papanicolaou smear of  cervix Pap sent Pap in 3 years Physical in 1 year   6. History of PCOS Continue weight loss efforts  7. Hidradenitis Try to keep clean and dry

## 2022-01-25 LAB — HIV ANTIBODY (ROUTINE TESTING W REFLEX): HIV Screen 4th Generation wRfx: NONREACTIVE

## 2022-01-25 LAB — HEPATITIS B SURFACE ANTIGEN: Hepatitis B Surface Ag: NEGATIVE

## 2022-01-25 LAB — RPR: RPR Ser Ql: NONREACTIVE

## 2022-01-25 LAB — HEPATITIS C ANTIBODY: Hep C Virus Ab: NONREACTIVE

## 2022-01-26 LAB — CYTOLOGY - PAP
Chlamydia: NEGATIVE
Comment: NEGATIVE
Comment: NEGATIVE
Comment: NORMAL
Diagnosis: NEGATIVE
High risk HPV: NEGATIVE
Neisseria Gonorrhea: NEGATIVE

## 2023-05-01 ENCOUNTER — Ambulatory Visit: Payer: PRIVATE HEALTH INSURANCE | Admitting: Women's Health

## 2023-05-01 ENCOUNTER — Encounter: Payer: Self-pay | Admitting: Women's Health

## 2023-05-01 VITALS — BP 134/84 | HR 91 | Ht 65.0 in | Wt 285.8 lb

## 2023-05-01 DIAGNOSIS — Z131 Encounter for screening for diabetes mellitus: Secondary | ICD-10-CM

## 2023-05-01 DIAGNOSIS — Z01419 Encounter for gynecological examination (general) (routine) without abnormal findings: Secondary | ICD-10-CM | POA: Diagnosis not present

## 2023-05-01 DIAGNOSIS — E282 Polycystic ovarian syndrome: Secondary | ICD-10-CM

## 2023-05-01 DIAGNOSIS — L659 Nonscarring hair loss, unspecified: Secondary | ICD-10-CM

## 2023-05-01 DIAGNOSIS — E119 Type 2 diabetes mellitus without complications: Secondary | ICD-10-CM

## 2023-05-01 DIAGNOSIS — Z6841 Body Mass Index (BMI) 40.0 and over, adult: Secondary | ICD-10-CM

## 2023-05-01 DIAGNOSIS — L732 Hidradenitis suppurativa: Secondary | ICD-10-CM

## 2023-05-01 MED ORDER — CLINDAMYCIN PHOSPHATE 1 % EX SOLN: Freq: Two times a day (BID) | CUTANEOUS | 6 refills | Status: DC

## 2023-05-01 NOTE — Progress Notes (Signed)
 WELL-WOMAN EXAMINATION Patient name: Megan Blackwell MRN [id number]  Date of birth: 1995/03/19 Chief Complaint:   Gynecologic Exam  History of Present Illness:   Megan Blackwell is a 28 y.o. G0P0000 Caucasian female being seen today for a routine well-woman exam. "Bruise" Rt breast since Nov, was tender to begin w/, now is not. Has some pictures- was flat and erythematous then became raised and round appearing like a boil, then flaky like it popped/drained.  Lt thigh intermittently numb since 2021. Hair thinning. Has PCOS, periods still somewhat irregular, occ goes 2-70mths but not often anymore, are becoming more regular. Doesn't track right now. Doesn't want pregnancy right now, not in a relationship.   PCP: none      does desire labs Patient's last menstrual period was 03/15/2023. The current method of family planning is abstinence.  Last pap 01/24/22. Results were: NILM w/ HRHPV negative. H/O abnormal pap: no Last mammogram: never. Results were: N/A. Family h/o breast cancer: no Last colonoscopy: never. Results were: N/A. Family h/o colorectal cancer: no     05/01/2023    8:40 AM 01/24/2022    3:36 PM 05/25/2020   10:44 AM 04/25/2019    2:45 PM 07/07/2016    1:36 PM  Depression screen PHQ 2/9  Decreased Interest 2 1 1 1  0  Down, Depressed, Hopeless 2 1 1 2  0  PHQ - 2 Score 4 2 2 3  0  Altered sleeping 3 2 2 2    Tired, decreased energy 1 1 1 2    Change in appetite 2 0 3 1   Feeling bad or failure about yourself  1 1 1 2    Trouble concentrating 2 0 0 0   Moving slowly or fidgety/restless 1 0 0 0   Suicidal thoughts 0 0 0 1   PHQ-9 Score 14 6 9 11    Difficult doing work/chores    Somewhat difficult         05/01/2023    8:41 AM 01/24/2022    3:37 PM 05/25/2020   10:44 AM  GAD 7 : Generalized Anxiety Score  Nervous, Anxious, on Edge 2 1 2   Control/stop worrying 2 1 1   Worry too much - different things 2 1 1   Trouble relaxing 1 0 0  Restless 0 0 0  Easily annoyed or irritable 2  1 3   Afraid - awful might happen 1 0 0  Total GAD 7 Score 10 4 7      Review of Systems:   Pertinent items are noted in HPI Denies any headaches, blurred vision, fatigue, shortness of breath, chest pain, abdominal pain, abnormal vaginal discharge/itching/odor/irritation, problems with periods, bowel movements, urination, or intercourse unless otherwise stated above. Pertinent History Reviewed:  Reviewed past medical,surgical, social and family history.  Reviewed problem list, medications and allergies. Physical Assessment:   Vitals:   05/01/23 0834  BP: 134/84  Pulse: 91  Weight: 285 lb 12.8 oz (129.6 kg)  Height: 5\' 5"  (1.651 m)  Body mass index is 47.56 kg/m.        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 "Bruise" Rt breast at 9 o'clock appears to be resolving boil, purplish coloration, superficial nontender knot  Abdomen - soft, nontender, nondistended, no masses or organomegaly  Pelvic - VULVA: normal appearing vulva with no masses, tenderness or lesions  VAGINA: normal appearing vagina with normal color and discharge, no lesions  CERVIX: normal appearing cervix without discharge or lesions, no CMT  Thin prep pap is not done   UTERUS: uterus is felt to be normal size, shape, consistency and nontender   ADNEXA: No adnexal masses or tenderness noted.  Extremities:  No swelling or varicosities noted  Chaperone: Peggy Dones  No results  found for this or any previous visit (from the past 24 hours).  Assessment & Plan:  1) Well-Woman Exam  2) ?Resolving boil Rt breast> continue to monitor, if doesn't completely resolve let me know  3) HS> rx clindamycin solution BID, gave printed info  4) PCOS> becoming more regular, still occ goes 2-100mths w/o period. Doesn't want to get on anything to regulate right now. Start tracking periods, let me know if goes 2-40mths w/o period and will rx provera  5) Thinning hair> worse after got Covid, discussed can also be from PCOS, will check TSH  6) Diabetes screening> A1C  7) BMI 47> discussed weight loss can help PCOS and HS, referral to dietician  Labs/procedures today: TSH, A1C  Mammogram: @ 29yo, or sooner if problems Colonoscopy: @ 29yo, or sooner if problems  Orders Placed This Encounter  Procedures   TSH   Hemoglobin A1c   Amb ref to Medical Nutrition Therapy-MNT    Meds:  Meds ordered this encounter  Medications   clindamycin (CLEOCIN T) 1 % external solution    Sig: Apply topically 2 (two) times daily.    Dispense:  30 mL    Refill:  6    Follow-up: Return in about 1 year (around 04/30/2024) for Pap & physical.  Cheral Marker CNM, [facility] 05/01/2023 10:19 AM

## 2023-05-01 NOTE — Patient Instructions (Addendum)
 Primary Care Providers Dr. Dwana Melena [place]) [phone] [facility] [phone] [facility] [phone] [facility] [place]) [phone] [facility] [place]) [phone] [facility]) [phone] [facility] [phone] [facility] [phone]   Hidradenitis Loose, light clothing. Avoid heat, friction, shearing. Don't squeeze! Wash clothes in perfume/dye free detergent Gentle non-soap cleanser, wash gently w/ fingers (No cloth, loofah), can use antibacterial cleanser Stop smoking if you smoke Weight loss Decrease/no dairy  Hidradenitis Suppurativa Hidradenitis suppurativa is a long-term (chronic) skin disease that starts with blocked sweat glands or hair follicles. Bacteria may grow in these blocked openings of your skin. Hidradenitis suppurativa is like a severe form of acne that develops in areas of your body where acne would be unusual. It is most likely to affect the areas of your body where skin rubs against skin and becomes moist. This includes your: Underarms. Groin. Genital areas. Buttocks. Upper thighs. Breasts. Hidradenitis suppurativa may start out with small pimples. The pimples can develop into deep sores that break open (rupture) and drain pus. Over time your skin may thicken and become scarred. Hidradenitis suppurativa cannot be passed from person to person. What are the causes? The exact cause of hidradenitis suppurativa is not known. This condition may be due to: Female and female hormones. The condition is rare before and after puberty. An overactive body defense system (immune system). Your immune system may overreact to the blocked hair follicles or sweat glands and cause swelling and pus-filled sores. What increases the risk? You may have a higher risk of hidradenitis suppurativa if you: Are a woman. Are between ages 50 and 9. Have a family history of  hidradenitis suppurativa. Have a personal history of acne. Are overweight. Smoke. Take the drug lithium. What are the signs or symptoms? The first signs of an outbreak are usually painful skin bumps that look like pimples. As the condition progresses: Skin bumps may get bigger and grow deeper into the skin. Bumps under the skin may rupture and drain smelly pus. Skin may become itchy and infected. Skin may thicken and scar. Drainage may continue through tunnels under the skin (fistulas). Walking and moving your arms can become painful. How is this diagnosed? Your health care provider may diagnose hidradenitis suppurativa based on your medical history and your signs and symptoms. A physical exam will also be done. You may need to see a health care provider who specializes in skin diseases (dermatologist). You may also have tests done to confirm the diagnosis. These can include: Swabbing a sample of pus or drainage from your skin so it can be sent to the lab and tested for infection. Blood tests to check for infection. How is this treated? The same treatment will not work for everybody with hidradenitis suppurativa. Your treatment will depend on how severe your symptoms are. You may need to try several treatments to find what works best for you. Part of your treatment may include cleaning and bandaging (dressing) your wounds. You may also have to take medicines, such as the following: Antibiotics. Acne medicines. Medicines to block or suppress the immune system. A diabetes medicine (metformin) is sometimes used to treat this condition. For women, birth control pills can sometimes help relieve symptoms. You may need surgery if you have a severe case of hidradenitis suppurativa that does not respond to medicine. Surgery may involve: Using a laser to clear the skin and remove hair follicles. Opening and draining deep sores. Removing the areas of skin that  are diseased and scarred. Follow these  instructions at home: Learn as much as you can about your disease, and work closely with your health care providers. Take medicines only as directed by your health care provider. If you were prescribed an antibiotic medicine, finish it all even if you start to feel better. If you are overweight, losing weight may be very helpful. Try to reach and maintain a healthy weight. Do not use any tobacco products, including cigarettes, chewing tobacco, or electronic cigarettes. If you need help quitting, ask your health care provider. Do not shave the areas where you get hidradenitis suppurativa. Do not wear deodorant. Wear loose-fitting clothes. Try not to overheat and get sweaty. Take a daily bleach bath as directed by your health care provider. Fill your bathtub halfway with water. Pour in  cup of unscented household bleach. Soak for 5-10 minutes. Cover sore areas with a warm, clean washcloth (compress) for 5-10 minutes. Contact a health care provider if: You have a flare-up of hidradenitis suppurativa. You have chills or a fever. You are having trouble controlling your symptoms at home. This information is not intended to replace advice given to you by your health care provider. Make sure you discuss any questions you have with your health care provider. Document Released: 09/29/2003 Document Revised: 07/23/2015 Document Reviewed: 05/17/2013 Elsevier Interactive Patient Education  2017 [employer] Inc.   Diet for Polycystic Ovary Syndrome Polycystic ovary syndrome (PCOS) is a common hormonal disorder that affects a woman's reproductive system. It can cause problems with menstrual periods and make it hard to get and stay pregnant. Changing what you eat can help your hormones reach normal levels, improve your health, and help you better manage PCOS. Following a balanced diet can help you lose weight and improve the way that your body uses the hormone insulin to control blood sugar. This may  include: Eating low-fat (lean) proteins, complex carbohydrates, fresh fruits and vegetables, low-fat dairy products, healthy fats, and fiber. Cutting down on calories. Exercising regularly. What are tips for following this plan? Follow a balanced diet for meals and snacks. Eat breakfast, lunch, dinner, and one or two snacks every day. Include protein in each meal and snack. Choose whole grains instead of products that are made with refined flour. Eat a variety of foods. Exercise regularly as told by your health care provider. Aim to do at least 30 minutes of exercise on most days of the week. If you are overweight or obese: Pay attention to how many calories you eat. Cutting down on calories can help you lose weight. Work with your health care provider or a dietitian to figure out how many calories you need each day. What foods should I eat?  Fruits Include a variety of colors and types. All fruits are helpful for PCOS. Vegetables Include a variety of colors and types. All vegetables are helpful for PCOS. Grains Whole grains, such as whole wheat. Whole-grain breads, crackers, cereals, and pasta. Unsweetened oatmeal. Bulgur, barley, quinoa, and brown rice. Tortillas made from corn or whole-wheat flour. Meats and other proteins Lean proteins, such as fish, chicken, beans, eggs, and tofu. Dairy Low-fat dairy products, such as skim milk, cheese sticks, and yogurt. Beverages Low-fat or fat-free drinks, such as water, low-fat milk, sugar-free drinks, and small amounts of 100% fruit juice. Seasonings and condiments Ketchup. Mustard. Barbecue sauce. Relish. Low-fat or fat-free mayonnaise. Fats and oils Olive oil or canola oil. Walnuts and almonds. The items listed above may not be a complete list  of recommended foods and beverages. Contact a dietitian for more options. What foods should I avoid? Foods that are high in calories or fat, especially saturated or trans fats. Fried foods. Sweets.  Products that are made from refined white flour, including white bread, pastries, white rice, and pasta. The items listed above may not be a complete list of foods and beverages to avoid. Contact a dietitian for more information. Summary PCOS is a hormonal imbalance that affects a woman's reproductive system. It can cause problems with menstrual periods and make it hard to get and stay pregnant. You can help to manage your PCOS by exercising regularly and eating a healthy, varied diet of vegetables, fruit, whole grains, lean protein, and low-fat dairy products. Changing what you eat can improve the way that your body uses insulin, help your hormones reach normal levels, and help you lose weight. This information is not intended to replace advice given to you by your health care provider. Make sure you discuss any questions you have with your health care provider. Document Revised: 01/02/2023 Document Reviewed: 01/02/2023 Elsevier Patient Education  2024 [employer].

## 2023-05-02 ENCOUNTER — Encounter: Payer: Self-pay | Admitting: Women's Health

## 2023-05-02 DIAGNOSIS — E119 Type 2 diabetes mellitus without complications: Secondary | ICD-10-CM | POA: Insufficient documentation

## 2023-05-02 LAB — HEMOGLOBIN A1C
Est. average glucose Bld gHb Est-mCnc: 295 mg/dL
Hgb A1c MFr Bld: 11.9 % — ABNORMAL HIGH (ref 4.8–5.6)

## 2023-05-02 LAB — TSH: TSH: 2.82 u[IU]/mL (ref 0.450–4.500)

## 2023-05-02 MED ORDER — METFORMIN HCL 500 MG PO TABS: 500.0000 mg | ORAL_TABLET | Freq: Two times a day (BID) | ORAL | 1 refills | Status: DC

## 2023-05-02 NOTE — Addendum Note (Signed)
 Addended by: Shawna Clamp R on: 05/02/2023 01:06 PM   Modules accepted: Orders

## 2023-05-10 ENCOUNTER — Ambulatory Visit: Payer: Self-pay | Admitting: Adult Health

## 2023-05-17 ENCOUNTER — Encounter: Payer: Self-pay | Admitting: Women's Health

## 2023-06-05 ENCOUNTER — Ambulatory Visit: Payer: PRIVATE HEALTH INSURANCE | Admitting: Physician Assistant

## 2023-06-13 ENCOUNTER — Ambulatory Visit (INDEPENDENT_AMBULATORY_CARE_PROVIDER_SITE_OTHER): Payer: PRIVATE HEALTH INSURANCE | Admitting: Physician Assistant

## 2023-06-13 ENCOUNTER — Encounter: Payer: Self-pay | Admitting: Physician Assistant

## 2023-06-13 ENCOUNTER — Other Ambulatory Visit: Payer: Self-pay | Admitting: Physician Assistant

## 2023-06-13 VITALS — BP 119/82 | HR 82 | Temp 97.7°F | Ht 65.0 in | Wt 288.0 lb

## 2023-06-13 DIAGNOSIS — E1169 Type 2 diabetes mellitus with other specified complication: Secondary | ICD-10-CM

## 2023-06-13 DIAGNOSIS — F321 Major depressive disorder, single episode, moderate: Secondary | ICD-10-CM

## 2023-06-13 DIAGNOSIS — Z7689 Persons encountering health services in other specified circumstances: Secondary | ICD-10-CM

## 2023-06-13 DIAGNOSIS — Z7984 Long term (current) use of oral hypoglycemic drugs: Secondary | ICD-10-CM

## 2023-06-13 DIAGNOSIS — E119 Type 2 diabetes mellitus without complications: Secondary | ICD-10-CM

## 2023-06-13 DIAGNOSIS — Z6841 Body Mass Index (BMI) 40.0 and over, adult: Secondary | ICD-10-CM | POA: Diagnosis not present

## 2023-06-13 MED ORDER — DAPAGLIFLOZIN PROPANEDIOL 5 MG PO TABS: 5.0000 mg | ORAL_TABLET | Freq: Every day | ORAL | 2 refills | Status: DC

## 2023-06-13 MED ORDER — EMPAGLIFLOZIN 10 MG PO TABS
10.0000 mg | ORAL_TABLET | Freq: Every day | ORAL | 1 refills | Status: DC
Start: 2023-06-13 — End: 2023-06-13

## 2023-06-13 MED ORDER — ALBUTEROL SULFATE HFA 108 (90 BASE) MCG/ACT IN AERS
1.0000 | INHALATION_SPRAY | Freq: Four times a day (QID) | RESPIRATORY_TRACT | 1 refills | Status: DC | PRN
Start: 2023-06-13 — End: 2023-09-18

## 2023-06-13 MED ORDER — METFORMIN HCL 1000 MG PO TABS: 1000.0000 mg | ORAL_TABLET | Freq: Two times a day (BID) | ORAL | 1 refills | Status: DC

## 2023-06-13 MED ORDER — SERTRALINE HCL 50 MG PO TABS: 50.0000 mg | ORAL_TABLET | Freq: Every day | ORAL | 3 refills | Status: DC

## 2023-06-13 NOTE — Assessment & Plan Note (Signed)
 Patient presents today with long history of depression and anxiety. PHQ 9 reviewed today. We discussed option for medication vs referral to therapy. Patient wishes to start medication today. Will go ahead with Zoloft 50mg  day. She denies self harm or SI. Patient counseled on side effects such as nausea and headache, she was advised to stop taking medication if she experiences suicidal ideation. Patient to follow up in 1 month.

## 2023-06-13 NOTE — Progress Notes (Signed)
 New Patient Office Visit  Subjective    Patient ID: Megan Blackwell, female    DOB: May 09, 1995  Age: 28 y.o. MRN: [id number]  CC:  Chief Complaint  Patient presents with   New Patient (Initial Visit)    First time been to dr since 28 years old    HPI Megan Blackwell presents to establish care  Patient presents today with past medical history significant for PCOS, new diagnosed diabetes, depression, and obesity. Patient recently seen by OBGYN for annual well woman and diagnosed with diabetes, started on metformin daily. Megan Blackwell has not seen diabetic education or endocrinology yet. Megan Blackwell reports increased depression recently, denies self harm or SI.   Outpatient Encounter Medications as of 06/13/2023  Medication Sig   acetaminophen (TYLENOL) 650 MG CR tablet    Biotin 5000 MCG CAPS    Cholecalciferol (VITAMIN D-3 PO) Take by mouth.   clindamycin (CLEOCIN T) 1 % external solution Apply topically 2 (two) times daily.   empagliflozin (JARDIANCE) 10 MG TABS tablet Take 1 tablet (10 mg total) by mouth daily.   Ferrous Sulfate (IRON PO) Take by mouth.   ibuprofen (ADVIL,MOTRIN) 200 MG tablet Take 200 mg by mouth as needed.   Magnesium 250 MG TABS    Melatonin 5 MG TABS Take 10 mg by mouth as needed.    metFORMIN (GLUCOPHAGE) 1000 MG tablet Take 1 tablet (1,000 mg total) by mouth 2 (two) times daily with a meal.   Omega-3 Fatty Acids (OMEGA 3 PO) Take by mouth.   sertraline (ZOLOFT) 50 MG tablet Take 1 tablet (50 mg total) by mouth daily.   Zinc 50 MG TABS    [DISCONTINUED] albuterol (PROVENTIL HFA;VENTOLIN HFA) 108 (90 BASE) MCG/ACT inhaler Inhale 1-2 puffs into the lungs every 6 (six) hours as needed for wheezing or shortness of breath.   [DISCONTINUED] metFORMIN (GLUCOPHAGE) 500 MG tablet Take 1 tablet (500 mg total) by mouth 2 (two) times daily with a meal.   albuterol (VENTOLIN HFA) 108 (90 Base) MCG/ACT inhaler Inhale 1-2 puffs into the lungs every 6 (six) hours as needed for wheezing or  shortness of breath.   No facility-administered encounter medications on file as of 06/13/2023.    Past Medical History:  Diagnosis Date   Abnormal liver ultrasound    Asthma    Diabetes mellitus without complication (HCC)    Mental disorder    Migraines    Obesity    PCO (polycystic ovaries)     Past Surgical History:  Procedure Laterality Date   ADENOIDECTOMY     FOOT SURGERY     TONSILLECTOMY      Family History  Problem Relation Age of Onset   Diabetes Maternal Grandmother    Cancer Maternal Grandfather    Diabetes Maternal Grandfather    Diabetes Mother    Osteoporosis Mother    Migraines Sister     Social History   Socioeconomic History   Marital status: Single    Spouse name: Not on file   Number of children: Not on file   Years of education: Not on file   Highest education level: Some college, no degree  Occupational History   Not on file  Tobacco Use   Smoking status: Never    Passive exposure: Yes   Smokeless tobacco: Never  Vaping Use   Vaping status: Never Used  Substance and Sexual Activity   Alcohol use: Yes    Comment: socially   Drug use: No  Sexual activity: Not Currently    Birth control/protection: None, Abstinence  Other Topics Concern   Not on file  Social History Narrative   Not on file   Social Drivers of Health   Financial Resource Strain: Low Risk  (06/09/2023)   Overall Financial Resource Strain (CARDIA)    Difficulty of Paying Living Expenses: Not very hard  Recent Concern: Financial Resource Strain - Medium Risk (05/01/2023)   Overall Financial Resource Strain (CARDIA)    Difficulty of Paying Living Expenses: Somewhat hard  Food Insecurity: No Food Insecurity (06/09/2023)   Hunger Vital Sign    Worried About Running Out of Food in the Last Year: Never true    Ran Out of Food in the Last Year: Never true  Recent Concern: Food Insecurity - Food Insecurity Present (05/01/2023)   Hunger Vital Sign    Worried About Running Out  of Food in the Last Year: Sometimes true    Ran Out of Food in the Last Year: Never true  Transportation Needs: No Transportation Needs (06/09/2023)   PRAPARE - Administrator, Civil Service (Medical): No    Lack of Transportation (Non-Medical): No  Physical Activity: Insufficiently Active (06/09/2023)   Exercise Vital Sign    Days of Exercise per Week: 3 days    Minutes of Exercise per Session: 40 min  Stress: Stress Concern Present (06/09/2023)   [employer] of Occupational Health - Occupational Stress Questionnaire    Feeling of Stress : To some extent  Social Connections: Socially Isolated (06/09/2023)   Social Connection and Isolation Panel [NHANES]    Frequency of Communication with Friends and Family: More than three times a week    Frequency of Social Gatherings with Friends and Family: Twice a week    Attends Religious Services: Never    Database administrator or Organizations: No    Attends Banker Meetings: Never    Marital Status: Never married  Intimate Partner Violence: Not At Risk (05/01/2023)   Humiliation, Afraid, Rape, and Kick questionnaire    Fear of Current or Ex-Partner: No    Emotionally Abused: No    Physically Abused: No    Sexually Abused: No    Review of Systems  Constitutional:  Negative for chills, fever and malaise/fatigue.  Eyes:  Negative for blurred vision and double vision.  Respiratory:  Negative for cough and shortness of breath.   Cardiovascular:  Negative for chest pain and palpitations.  Musculoskeletal:  Negative for joint pain and myalgias.  Neurological:  Negative for dizziness and headaches.  Psychiatric/Behavioral:  Positive for depression. The patient is not nervous/anxious.         Objective    BP 119/82   Pulse 82   Temp 97.7 F (36.5 C)   Ht 5\' 5"  (1.651 m)   Wt 288 lb (130.6 kg)   SpO2 100%   BMI 47.93 kg/m   Physical Exam Constitutional:      Appearance: Normal appearance. Megan Blackwell is obese.   HENT:     Head: Normocephalic.     Mouth/Throat:     Mouth: Mucous membranes are moist.     Pharynx: Oropharynx is clear.  Eyes:     Extraocular Movements: Extraocular movements intact.     Conjunctiva/sclera: Conjunctivae normal.  Cardiovascular:     Rate and Rhythm: Normal rate and regular rhythm.     Heart sounds: Normal heart sounds. No murmur heard. Pulmonary:     Effort: Pulmonary effort is  normal.     Breath sounds: Normal breath sounds.  Skin:    General: Skin is warm and dry.  Neurological:     General: No focal deficit present.     Mental Status: Megan Blackwell is alert and oriented to person, place, and time.  Psychiatric:        Mood and Affect: Mood normal.        Behavior: Behavior normal.    Results for orders placed or performed in visit on 05/01/23  TSH   Collection Time: 05/01/23  9:29 AM  Result Value Ref Range   TSH 2.820 0.450 - 4.500 uIU/mL  Hemoglobin A1c   Collection Time: 05/01/23  9:29 AM  Result Value Ref Range   Hgb A1c MFr Bld 11.9 (H) 4.8 - 5.6 %   Est. average glucose Bld gHb Est-mCnc 295 mg/dL       Assessment & Plan:  Encounter to establish care  Type 2 diabetes mellitus without complication, without long-term current use of insulin (HCC) Assessment & Plan: Patient presents today with newly diagnosed type 2 diabetes. Recent A1c of 11.9. Started on metformin 500mg  BID in March. Patient referred to endocrinology, however not yet scheduled. Patient to see diabetic education at the end of the month. We discussed healthy diet and exercise today. We discussed need of additional medications to manage diabetes. Discussed options like basal insulin, GLPs, and other oral agents. I advised insulin as our best course of action to lower sugars. Patient wishes to avoid injections, and states Megan Blackwell would like to use insulin as a last resort. We discussed risks of uncontrolled diabetes such as kidney damage, blindness, loss of limb, heart attack and stroke. At this  time starting patient on Jardiance 10mg  daily and increasing metformin to 1000mg  BID. Patient given the number to call and schedule for an endocrinology appointment. Lab work today to include CBC, CMP, and lipid panel. Patient to follow up in 1 month.   Orders: -     Empagliflozin; Take 1 tablet (10 mg total) by mouth daily.  Dispense: 90 tablet; Refill: 1 -     CBC with Differential/Platelet -     CMP14+EGFR -     Lipid panel -     metFORMIN HCl; Take 1 tablet (1,000 mg total) by mouth 2 (two) times daily with a meal.  Dispense: 180 tablet; Refill: 1  Current moderate episode of major depressive disorder, unspecified whether recurrent [facility]) Assessment & Plan: Patient presents today with long history of depression and anxiety. PHQ 9 reviewed today. We discussed option for medication vs referral to therapy. Patient wishes to start medication today. Will go ahead with Zoloft 50mg  day. Megan Blackwell denies self harm or SI. Patient counseled on side effects such as nausea and headache, Megan Blackwell was advised to stop taking medication if Megan Blackwell experiences suicidal ideation. Patient to follow up in 1 month.     Adult BMI 45.0-49.9 kg/sq m [facility]) Assessment & Plan: We discussed healthy diet and exercise. Patient increasing daily exercise with walking. We talked about increasing whole foods in Megan Blackwell diet and avoid simple carbs. Megan Blackwell was advised whole grains, lean proteins, fruits, and vegetables. Patient advised to avoid soda and sugary beverages, sweets, fast and processed foods. Megan Blackwell was encouraged to continue follow up with diabetic education.   Orders: -     CBC with Differential/Platelet -     Sertraline HCl; Take 1 tablet (50 mg total) by mouth daily.  Dispense: 30 tablet; Refill: 3  Other orders -  Albuterol Sulfate HFA; Inhale 1-2 puffs into the lungs every 6 (six) hours as needed for wheezing or shortness of breath.  Dispense: 8 g; Refill: 1    Return in about 5 weeks (around 07/18/2023) for depression and  diabetes .   Jearlean Mince Adelard Sanon, PA-C

## 2023-06-13 NOTE — Assessment & Plan Note (Signed)
 We discussed healthy diet and exercise. Patient increasing daily exercise with walking. We talked about increasing whole foods in her diet and avoid simple carbs. She was advised whole grains, lean proteins, fruits, and vegetables. Patient advised to avoid soda and sugary beverages, sweets, fast and processed foods. She was encouraged to continue follow up with diabetic education.

## 2023-06-13 NOTE — Assessment & Plan Note (Signed)
 Patient presents today with newly diagnosed type 2 diabetes. Recent A1c of 11.9. Started on metformin 500mg  BID in March. Patient referred to endocrinology, however not yet scheduled. Patient to see diabetic education at the end of the month. We discussed healthy diet and exercise today. We discussed need of additional medications to manage diabetes. Discussed options like basal insulin, GLPs, and other oral agents. I advised insulin as our best course of action to lower sugars. Patient wishes to avoid injections, and states she would like to use insulin as a last resort. We discussed risks of uncontrolled diabetes such as kidney damage, blindness, loss of limb, heart attack and stroke. At this time starting patient on Jardiance 10mg  daily and increasing metformin to 1000mg  BID. Patient given the number to call and schedule for an endocrinology appointment. Lab work today to include CBC, CMP, and lipid panel. Patient to follow up in 1 month.

## 2023-06-14 ENCOUNTER — Encounter: Payer: Self-pay | Admitting: Physician Assistant

## 2023-06-14 LAB — CBC WITH DIFFERENTIAL/PLATELET
Basophils Absolute: 0.1 10*3/uL (ref 0.0–0.2)
Basos: 1 %
EOS (ABSOLUTE): 0.4 10*3/uL (ref 0.0–0.4)
Eos: 5 %
Hematocrit: 42.8 % (ref 34.0–46.6)
Hemoglobin: 13.9 g/dL (ref 11.1–15.9)
Immature Grans (Abs): 0 10*3/uL (ref 0.0–0.1)
Immature Granulocytes: 0 %
Lymphocytes Absolute: 2.5 10*3/uL (ref 0.7–3.1)
Lymphs: 28 %
MCH: 26.3 pg — ABNORMAL LOW (ref 26.6–33.0)
MCHC: 32.5 g/dL (ref 31.5–35.7)
MCV: 81 fL (ref 79–97)
Monocytes Absolute: 0.4 10*3/uL (ref 0.1–0.9)
Monocytes: 5 %
Neutrophils Absolute: 5.4 10*3/uL (ref 1.4–7.0)
Neutrophils: 61 %
Platelets: 314 10*3/uL (ref 150–450)
RBC: 5.28 x10E6/uL (ref 3.77–5.28)
RDW: 13.1 % (ref 11.7–15.4)
WBC: 8.8 10*3/uL (ref 3.4–10.8)

## 2023-06-14 LAB — CMP14+EGFR
ALT: 41 IU/L — ABNORMAL HIGH (ref 0–32)
AST: 20 IU/L (ref 0–40)
Albumin: 4.2 g/dL (ref 4.0–5.0)
Alkaline Phosphatase: 96 IU/L (ref 44–121)
BUN/Creatinine Ratio: 24 — ABNORMAL HIGH (ref 9–23)
BUN: 11 mg/dL (ref 6–20)
Bilirubin Total: 0.4 mg/dL (ref 0.0–1.2)
CO2: 18 mmol/L — ABNORMAL LOW (ref 20–29)
Calcium: 9.3 mg/dL (ref 8.7–10.2)
Chloride: 101 mmol/L (ref 96–106)
Creatinine, Ser: 0.45 mg/dL — ABNORMAL LOW (ref 0.57–1.00)
Globulin, Total: 2.7 g/dL (ref 1.5–4.5)
Glucose: 328 mg/dL — ABNORMAL HIGH (ref 70–99)
Potassium: 4.3 mmol/L (ref 3.5–5.2)
Sodium: 136 mmol/L (ref 134–144)
Total Protein: 6.9 g/dL (ref 6.0–8.5)
eGFR: 134 mL/min/{1.73_m2} (ref >59)

## 2023-06-14 LAB — LIPID PANEL
Chol/HDL Ratio: 5.5 ratio — ABNORMAL HIGH (ref 0.0–4.4)
Cholesterol, Total: 186 mg/dL (ref 100–199)
HDL: 34 mg/dL — ABNORMAL LOW (ref >39)
LDL Chol Calc (NIH): 123 mg/dL — ABNORMAL HIGH (ref 0–99)
Triglycerides: 161 mg/dL — ABNORMAL HIGH (ref 0–149)
VLDL Cholesterol Cal: 29 mg/dL (ref 5–40)

## 2023-06-15 ENCOUNTER — Other Ambulatory Visit: Payer: Self-pay | Admitting: Physician Assistant

## 2023-06-15 DIAGNOSIS — E119 Type 2 diabetes mellitus without complications: Secondary | ICD-10-CM

## 2023-06-15 MED ORDER — SITAGLIPTIN PHOSPHATE 25 MG PO TABS: 25.0000 mg | ORAL_TABLET | Freq: Every day | ORAL | 1 refills | Status: DC

## 2023-06-19 ENCOUNTER — Other Ambulatory Visit: Payer: Self-pay | Admitting: Physician Assistant

## 2023-06-19 DIAGNOSIS — E119 Type 2 diabetes mellitus without complications: Secondary | ICD-10-CM

## 2023-06-20 ENCOUNTER — Telehealth: Payer: Self-pay

## 2023-06-20 NOTE — Progress Notes (Signed)
 Care Guide Pharmacy Note  06/20/2023 Name: [name] MRN: [id number] DOB: 1995/10/20  Referred By: Marco Severs, PA-C Reason for referral: Complex Care Management (Outreach to schedule with pharm d )   Megan Blackwell is a 28 y.o. year old female who is a primary care patient of Grooms, Jearlean Mince, [place].  BERTINE SCHLOTTMAN was referred to the pharmacist for assistance related to: DMII  Successful contact was made with the patient to discuss pharmacy services including being ready for the pharmacist to call at least 5 minutes before the scheduled appointment time and to have medication bottles and any blood pressure readings ready for review. The patient agreed to meet with the pharmacist via telephone visit on (date/time).06/22/2023  Lenton Rail , RMA     [place]  [facility], [facility] Guide  Direct Dial: [phone]  Website: [email or web address]

## 2023-06-22 ENCOUNTER — Other Ambulatory Visit: Payer: Self-pay

## 2023-06-22 NOTE — Progress Notes (Unsigned)
 06/23/2023 Name: Megan Blackwell MRN: [id number] DOB: 04-Apr-1995  Chief Complaint  Patient presents with   Diabetes   Medication Management    Megan Blackwell is a 28 y.o. year old female who presented for a telephone visit.   They were referred to the pharmacist by their PCP for assistance in managing diabetes and medication access.    Subjective:  Care Team: Primary Care Provider: Grooms, Alayne Hubert ; Next Scheduled Visit:  Future Appointments  Date Time Provider Department Center  06/29/2023 10:00 AM Rolando Cliche, [facility] CHL-POPH None  07/10/2023  8:00 AM Jodelle Mungo, RD NDM-NDMR None  07/26/2023  8:20 AM Grooms, Alayne Hubert RFM-RFM RFML  09/25/2023  1:00 PM Wendel Hals, NP REA-REA None      Medication Access/Adherence  Current Pharmacy:  [id number] - [place], [place] - [address] [facility] [address] [place] [address] Phone: [phone] Fax: [phone]  [facility] - [place], [place] - [address] [place] [address] Phone: [phone] Fax: [phone]  [facility] [place], [place] - [address] [place] [address] Phone: [phone] Fax: [phone]   Patient reports affordability concerns with their medications: Yes  Patient reports access/transportation concerns to their pharmacy: No  Patient reports adherence concerns with their medications:  Yes  cost concerns - Both Jardiance  and Januvia  are high copay    Diabetes:  Current medications:  Medications tried in the past:   Current glucose readings: not testing in home; has appt with RD 06/26/2022  Patient denies hypoglycemic s/sx including dizziness, shakiness, sweating. Patient denies hyperglycemic symptoms including polyuria, polydipsia, polyphagia, nocturia, neuropathy, blurred vision.   Current medication access support:    Objective:  Lab Results  Component Value  Date   HGBA1C 11.9 (H) 05/01/2023    Lab Results  Component Value Date   CREATININE 0.45 (L) 06/13/2023   BUN 11 06/13/2023   NA 136 06/13/2023   K 4.3 06/13/2023   CL 101 06/13/2023   CO2 18 (L) 06/13/2023    Lab Results  Component Value Date   CHOL 186 06/13/2023   HDL 34 (L) 06/13/2023   LDLCALC 123 (H) 06/13/2023   TRIG 161 (H) 06/13/2023   CHOLHDL 5.5 (H) 06/13/2023    Medications Reviewed Today   Medications were not reviewed in this encounter      Assessment/Plan:   Diabetes: - Currently uncontrolled - Reviewed long term cardiovascular and renal outcomes of uncontrolled blood sugar - Reviewed goal A1c, goal fasting, and goal 2 hour post prandial glucose - Reviewed dietary modifications including reducing carb intake - Patient denies personal or family history of multiple endocrine neoplasia type 2, medullary thyroid cancer; personal history of pancreatitis or gallbladder disease. - Recommend to check glucose at least daily  - [employer] to check on costs of some the newer diabetes medications including ozempic, trulicity, rybelsus, jardiance , farxiga . All would be in excess of $100 even after applying any sort of copay card. - There might be a possibility of receiving Januvia  through [facility] $125/qty of 84 per insurance rep, but could not confirm with confidence. However this option is not going to get her diabetes to goal.  - Declines starting on insulin at this time - Last OV w/ PCP sent increased metformin  dose to pharmacy (1000mg  twice daily) - Given A1c >9%/FBG >250. I think glipizide at  least 5mg  BID would be reasonable to add on if patient is agreeable. This would be $10 through her insurance. I attempted to call to review this as a potential option, and would want to further review general expectations - how to take, side effects, etc.,  Follow Up Plan: 1 wk check in call   Future Appointments  Date Time Provider  Department Center  06/29/2023 10:00 AM Rolando Cliche, [facility] CHL-POPH None  07/10/2023  8:00 AM Jodelle Mungo, RD NDM-NDMR None  07/26/2023  8:20 AM Grooms, Alayne Hubert RFM-RFM RFML  09/25/2023  1:00 PM Wendel Hals, NP REA-REA None

## 2023-06-23 ENCOUNTER — Other Ambulatory Visit: Payer: Self-pay | Admitting: Physician Assistant

## 2023-06-23 ENCOUNTER — Telehealth: Payer: Self-pay

## 2023-06-23 DIAGNOSIS — E119 Type 2 diabetes mellitus without complications: Secondary | ICD-10-CM

## 2023-06-23 MED ORDER — GLIPIZIDE 5 MG PO TABS: 5.0000 mg | ORAL_TABLET | Freq: Two times a day (BID) | ORAL | 3 refills | Status: DC

## 2023-06-23 NOTE — Progress Notes (Signed)
   06/23/2023  Patient ID: Megan Blackwell, female   DOB: [birth year], 28 y.o.   MRN: [id number]  Attempted to reach out to patient regarding diabetes medications and my conversation with her insurance company. If patient returns call she is welcome to call me directly at the number below.   Rolando Cliche, PharmD, BCGP Clinical Pharmacist  [phone]

## 2023-06-26 ENCOUNTER — Ambulatory Visit: Payer: PRIVATE HEALTH INSURANCE | Admitting: Nutrition

## 2023-06-29 ENCOUNTER — Other Ambulatory Visit: Payer: Self-pay

## 2023-06-29 NOTE — Progress Notes (Signed)
 06/29/2023 Name: Megan Blackwell MRN: [id number] DOB: 1995/03/24  Chief Complaint  Patient presents with   Medication Management   Diabetes    Megan Blackwell is a 28 y.o. year old female who presented for a telephone visit.   They were referred to the pharmacist by their PCP for assistance in managing diabetes and medication access.    Subjective:  Care Team: Primary Care Provider: Grooms, Alayne Hubert ; Next Scheduled Visit:  Future Appointments  Date Time Provider Department Center  07/10/2023  8:00 AM Jodelle Mungo, RD NDM-NDMR None  07/20/2023 10:00 AM Rolando Cliche, [facility] CHL-POPH None  07/26/2023  8:20 AM Grooms, Alayne Hubert RFM-RFM RFML  09/25/2023  1:00 PM Wendel Hals, NP REA-REA None      Medication Access/Adherence  Current Pharmacy:  [id number] - [place], [place] - [address] [facility] [address] [place] [address] Phone: [phone] Fax: [phone]  [facility] - [place], [place] - [address] [place] [address] Phone: [phone] Fax: [phone]  [facility] - [place], [place] - [address] [place] [address] Phone: [phone] Fax: [phone]   Patient reports affordability concerns with their medications: Yes  Patient reports access/transportation concerns to their pharmacy: No  Patient reports adherence concerns with their medications:  Yes  cost concerns - Both Jardiance  and Januvia  are high copay    Diabetes:  Current medications:  Medications tried in the past:   Current glucose readings: not testing in home; has appt with RD 06/26/2022  Patient denies hypoglycemic s/sx including dizziness, shakiness, sweating. Patient denies hyperglycemic symptoms including polyuria, polydipsia, polyphagia, nocturia, neuropathy, blurred vision.   Current medication access support:    Objective:  Lab Results  Component Value  Date   HGBA1C 11.9 (H) 05/01/2023    Lab Results  Component Value Date   CREATININE 0.45 (L) 06/13/2023   BUN 11 06/13/2023   NA 136 06/13/2023   K 4.3 06/13/2023   CL 101 06/13/2023   CO2 18 (L) 06/13/2023    Lab Results  Component Value Date   CHOL 186 06/13/2023   HDL 34 (L) 06/13/2023   LDLCALC 123 (H) 06/13/2023   TRIG 161 (H) 06/13/2023   CHOLHDL 5.5 (H) 06/13/2023    Medications Reviewed Today   Medications were not reviewed in this encounter      Assessment/Plan:   Diabetes:  06/25/23 - Currently uncontrolled - Reviewed long term cardiovascular and renal outcomes of uncontrolled blood sugar - Reviewed goal A1c, goal fasting, and goal 2 hour post prandial glucose - Reviewed dietary modifications including reducing carb intake - Patient denies personal or family history of multiple endocrine neoplasia type 2, medullary thyroid cancer; personal history of pancreatitis or gallbladder disease. - Recommend to check glucose at least daily  - [employer] to check on costs of some the newer diabetes medications including ozempic, trulicity, rybelsus, jardiance , farxiga . All would be in excess of $100 even after applying any sort of copay card. - There might be a possibility of receiving Januvia  through [facility] $125/qty of 84 per insurance rep, but could not confirm with confidence. However this option is not going to get her diabetes to goal.  - Declines starting on insulin at this time - Last OV w/ PCP sent increased metformin  dose to pharmacy (1000mg  twice daily) - Given A1c >9%/FBG >250. I think  glipizide  at least 5mg  BID would be reasonable to add on if patient is agreeable. This would be $10 through her insurance. I attempted to call to review this as a potential option, and would want to further review general expectations - how to take, side effects, etc.,  Follow Up Plan: 1 wk check in call  06/29/2023 - Counseled patient  regarding starting on glipizide  5mg  twice daily. Understands to take before a meal twice daily, risk of low blood sugar.  - Confirmed there is no copay issues with glipizide . Patient reviewed with pharmacy app while on phone with me and is less than $9. Agreeable to starting on this new rx in addition to metformin  1000mg  Bid, which was started 05/2023. - Also discussed high copays of newer medications with patient   Future Appointments  Date Time Provider Department Center  07/10/2023  8:00 AM Jodelle Mungo, RD NDM-NDMR None  07/20/2023 10:00 AM Rolando Cliche, [facility] CHL-POPH None  07/26/2023  8:20 AM Grooms, Alayne Hubert RFM-RFM RFML  09/25/2023  1:00 PM Wendel Hals, NP REA-REA None    Rolando Cliche, PharmD, BCGP Clinical Pharmacist  [phone]

## 2023-07-10 ENCOUNTER — Encounter: Payer: PRIVATE HEALTH INSURANCE | Attending: Women's Health | Admitting: Nutrition

## 2023-07-10 VITALS — Ht 65.0 in | Wt 290.8 lb

## 2023-07-10 DIAGNOSIS — E1100 Type 2 diabetes mellitus with hyperosmolarity without nonketotic hyperglycemic-hyperosmolar coma (NKHHC): Secondary | ICD-10-CM | POA: Insufficient documentation

## 2023-07-10 DIAGNOSIS — L732 Hidradenitis suppurativa: Secondary | ICD-10-CM | POA: Insufficient documentation

## 2023-07-10 DIAGNOSIS — Z6841 Body Mass Index (BMI) 40.0 and over, adult: Secondary | ICD-10-CM | POA: Insufficient documentation

## 2023-07-10 NOTE — Progress Notes (Signed)
 Diabetes Self-Management Education  Visit Type: First/Initial  Appt. Start Time: 0755 Appt. End Time: 4696  07/10/2023  [name], identified by name and date of birth, is a 28 y.o. female with a diagnosis of Diabetes: Type 2.   ASSESSMENT  Height 5\' 5"  (1.651 m), weight 290 lb 12.8 oz (131.9 kg). Body mass index is 48.39 kg/m.   Diabetes Self-Management Education - 07/10/23 0805       Visit Information   Visit Type First/Initial      Initial Visit   Diabetes Type Type 2    Are you currently following a meal plan? No    Are you taking your medications as prescribed? Yes      Health Coping   How would you rate your overall health? Good      Psychosocial Assessment   Patient Belief/Attitude about Diabetes Denial    What is the hardest part about your diabetes right now, causing you the most concern, or is the most worrisome to you about your diabetes?   Making healty food and beverage choices    Self-care barriers None    Self-management support Doctor's office;Family    Other persons present Patient    Patient Concerns Nutrition/Meal planning;Monitoring;Healthy Lifestyle;Weight Control;Medication    Special Needs None    Learning Readiness Contemplating    How often do you need to have someone help you when you read instructions, pamphlets, or other written materials from your doctor or pharmacy? 1 - Never    What is the last grade level you completed in school? 12      Pre-Education Assessment   Patient understands the diabetes disease and treatment process. Needs Instruction    Patient understands incorporating nutritional management into lifestyle. Needs Instruction    Patient undertands incorporating physical activity into lifestyle. Needs Instruction    Patient understands using medications safely. Needs Instruction    Patient understands monitoring blood glucose, interpreting and using results Needs Instruction    Patient understands prevention, detection,  and treatment of acute complications. Needs Instruction    Patient understands prevention, detection, and treatment of chronic complications. Needs Instruction    Patient understands how to develop strategies to address psychosocial issues. Needs Instruction    Patient understands how to develop strategies to promote health/change behavior. Needs Instruction      Complications   Last HgB A1C per patient/outside source 11 %    How often do you check your blood sugar? 0 times/day (not testing)    Number of hyperglycemic episodes ( >200mg /dL): --   not testing; doesn't know   Have you had a dilated eye exam in the past 12 months? No    Have you had a dental exam in the past 12 months? No    Are you checking your feet? No      Dietary Intake   Breakfast skips    Lunch Eats out usually    Dinner Eats at home with her grandmother.    Beverage(s) soda, water      Activity / Exercise   Activity / Exercise Type ADL's      Patient Education   Disease Pathophysiology Definition of diabetes, type 1 and 2, and the diagnosis of diabetes;Factors that contribute to the development of diabetes    Healthy Eating Role of diet in the treatment of diabetes and the relationship between the three main macronutrients and blood glucose level;Plate Method;Reviewed blood glucose goals for pre and post meals and how to evaluate  the patients' food intake on their blood glucose level.;Meal timing in regards to the patients' current diabetes medication.    Being Active Role of exercise on diabetes management, blood pressure control and cardiac health.    Medications Reviewed patients medication for diabetes, action, purpose, timing of dose and side effects.    Monitoring Interpreting lab values - A1C, lipid, urine microalbumina.;Identified appropriate SMBG and/or A1C goals.;Taught/discussed recording of test results and interpretation of SMBG.;Yearly dilated eye exam;Daily foot exams    Acute complications Taught  prevention, symptoms, and  treatment of hypoglycemia - the 15 rule.;Discussed and identified patients' prevention, symptoms, and treatment of hyperglycemia.    Chronic complications Relationship between chronic complications and blood glucose control;Lipid levels, blood glucose control and heart disease;Retinopathy and reason for yearly dilated eye exams;Nephropathy, what it is, prevention of, the use of ACE, ARB's and early detection of through urine microalbumia.;Reviewed with patient heart disease, higher risk of, and prevention    Diabetes Stress and Support Identified and addressed patients feelings and concerns about diabetes;Worked with patient to identify barriers to care and solutions;Brainstormed with patient on coping mechanisms for social situations, getting support from significant others, dealing with feelings about diabetes    Lifestyle and Health Coping Lifestyle issues that need to be addressed for better diabetes care      Post-Education Assessment   Patient understands the diabetes disease and treatment process. Needs Review    Patient understands incorporating nutritional management into lifestyle. Needs Review    Patient undertands incorporating physical activity into lifestyle. Needs Review    Patient understands using medications safely. Needs Review    Patient understands monitoring blood glucose, interpreting and using results Needs Review    Patient understands prevention, detection, and treatment of acute complications. Needs Review    Patient understands prevention, detection, and treatment of chronic complications. Needs Review    Patient understands how to develop strategies to address psychosocial issues. Needs Review    Patient understands how to develop strategies to promote health/change behavior. Needs Review      Outcomes   Expected Outcomes Demonstrated interest in learning but significant barriers to change    Future DMSE 4-6 wks    Program Status Not  Completed             Individualized Plan for Diabetes Self-Management Training:   Learning Objective:  Patient will have a greater understanding of diabetes self-management. Patient education plan is to attend individual and/or group sessions per assessed needs and concerns.   Plan:   Patient Instructions  Goals Drink a gallon f water per day Cut out sodas Eat breakfast daily Try some new  Call MD to get prescription for meter and testing supplies Test blood sugars twice a day. Morning blood sugar goal 80-130 Bedtime 100-150 mg/dl.   Expected Outcomes:  Demonstrated interest in learning but significant barriers to change  Education material provided: ADA - How to Thrive: A Guide for Your Journey with Diabetes, A1C conversion sheet, My Plate, Support group flyer, and Carbohydrate counting sheet  If problems or questions, patient to contact team via:  Phone and Email  Future DSME appointment: 4-6 wks

## 2023-07-10 NOTE — Patient Instructions (Signed)
 Goals Drink a gallon f water per day Cut out sodas Eat breakfast daily Try some new  Call MD to get prescription for meter and testing supplies Test blood sugars twice a day. Morning blood sugar goal 80-130 Bedtime 100-150 mg/dl.

## 2023-07-17 ENCOUNTER — Ambulatory Visit: Payer: PRIVATE HEALTH INSURANCE | Admitting: Physician Assistant

## 2023-07-20 ENCOUNTER — Telehealth: Payer: Self-pay

## 2023-07-20 ENCOUNTER — Other Ambulatory Visit: Payer: PRIVATE HEALTH INSURANCE

## 2023-07-20 NOTE — Progress Notes (Signed)
 07/20/2023 Name: Megan Blackwell MRN: [id number] DOB: 30-Oct-1995  No chief complaint on file.   Megan Blackwell is a 28 y.o. year old female who presented for a telephone visit.   They were referred to the pharmacist by their PCP for assistance in managing diabetes and medication access.    Subjective:  Care Team: Primary Care Provider: Grooms, Alayne Hubert ; Next Scheduled Visit:  Future Appointments  Date Time Provider Department Center  07/26/2023  8:20 AM Grooms, Alayne Hubert RFM-RFM RFML  08/07/2023  8:00 AM Jodelle Mungo, RD NDM-NDMR None  08/24/2023 10:00 AM Rolando Cliche, RPH CHL-POPH None  09/25/2023  1:00 PM Wendel Hals, NP REA-REA None      Medication Access/Adherence  Current Pharmacy:  [id number] - [place], [place] - [address] AT [facility] [address] [place] [address] Phone: [phone] Fax: [phone]  [id number] - [place], [place] - [address] [place] [address] Phone: [phone] Fax: [phone]  [facility] - [place], [place] - [address] [place] [address] Phone: [phone] Fax: [phone]   Patient reports affordability concerns with their medications: Yes  Patient reports access/transportation concerns to their pharmacy: No  Patient reports adherence concerns with their medications:  Yes  cost concerns - Both Jardiance  and Januvia  are high copay    Diabetes:  Current medications:  Medications tried in the past:   Current glucose readings: not testing in home; has appt with RD 06/26/2022  Patient denies hypoglycemic s/sx including dizziness, shakiness, sweating. Patient denies hyperglycemic symptoms including polyuria, polydipsia, polyphagia, nocturia, neuropathy, blurred vision.   Current medication access support:    Objective:  Lab Results  Component Value Date   HGBA1C 11.9 (H) 05/01/2023    Lab Results   Component Value Date   CREATININE 0.45 (L) 06/13/2023   BUN 11 06/13/2023   NA 136 06/13/2023   K 4.3 06/13/2023   CL 101 06/13/2023   CO2 18 (L) 06/13/2023    Lab Results  Component Value Date   CHOL 186 06/13/2023   HDL 34 (L) 06/13/2023   LDLCALC 123 (H) 06/13/2023   TRIG 161 (H) 06/13/2023   CHOLHDL 5.5 (H) 06/13/2023    Medications Reviewed Today     Reviewed by Rolando Cliche, [facility] (Pharmacist) on 07/20/23 at 1028  Med List Status: <None>   Medication Order Taking? Sig Documenting Provider Last Dose Status Informant  acetaminophen  (TYLENOL ) 650 MG CR tablet [id number] No  [provider] Taking Active   albuterol  (VENTOLIN  HFA) 108 (90 Base) MCG/ACT inhaler [id number] No Inhale 1-2 puffs into the lungs every 6 (six) hours as needed for wheezing or shortness of breath. Grooms, Alayne Hubert Taking Active   Biotin 5000 MCG CAPS [id number] No   Patient not taking: Reported on 07/10/2023   [provider] Not Taking Active   Cholecalciferol (VITAMIN D-3 PO) [id number] No Take by mouth.  Patient not taking: Reported on 07/10/2023   [provider] Not Taking Active   clindamycin  (CLEOCIN  T) 1 % external solution [id number] No Apply topically 2 (two) times daily. Ferd Householder, CNM Taking Active   Ferrous Sulfate (IRON PO) [id number] No Take by mouth.  Patient not taking: Reported on 07/10/2023   [provider] Not Taking Active   glipiZIDE  (GLUCOTROL ) 5 MG tablet [id number] No Take 1 tablet (5 mg total) by mouth 2 (two) times daily  before a meal. Grooms, [place], [place] Taking Active   ibuprofen  (ADVIL ,MOTRIN ) 200 MG tablet [id number] No Take 200 mg by mouth as needed. [provider] Taking Active   Magnesium 250 MG TABS [id number] No   Patient not taking: Reported on 07/10/2023   [provider] Not Taking Active   Melatonin 5 MG TABS [id number] No Take 10 mg by mouth as needed.  [provider] Taking Active Self   metFORMIN  (GLUCOPHAGE ) 1000 MG tablet [id number] No Take 1 tablet (1,000 mg total) by mouth 2 (two) times daily with a meal. Grooms, [place], PA-C Taking Active   Omega-3 Fatty Acids (OMEGA 3 PO) [id number] No Take by mouth.  Patient not taking: Reported on 07/10/2023   [provider] Not Taking Active   sertraline  (ZOLOFT ) 50 MG tablet [id number] No Take 1 tablet (50 mg total) by mouth daily.  Patient not taking: Reported on 07/10/2023   Grooms, [place], [place] Not Taking Active            Med Note Romie Coast Jul 10, 2023  9:11 AM) Will start taking this weekend.   sitaGLIPtin  (JANUVIA ) 25 MG tablet [id number] No Take 1 tablet (25 mg total) by mouth daily.  Patient not taking: Reported on 07/10/2023   Grooms, Alayne Hubert Not Taking Active   Zinc 50 MG TABS [id number] No   Patient not taking: Reported on 07/10/2023   [provider] Not Taking Active              Assessment/Plan:   Diabetes:  06/25/23 - Currently uncontrolled - Reviewed long term cardiovascular and renal outcomes of uncontrolled blood sugar - Reviewed goal A1c, goal fasting, and goal 2 hour post prandial glucose - Reviewed dietary modifications including reducing carb intake - Patient denies personal or family history of multiple endocrine neoplasia type 2, medullary thyroid cancer; personal history of pancreatitis or gallbladder disease. - Recommend to check glucose at least daily  - [employer] to check on costs of some the newer diabetes medications including ozempic, trulicity, rybelsus, jardiance , farxiga . All would be in excess of $100 even after applying any sort of copay card. - There might be a possibility of receiving Januvia  through [facility] $125/qty of 84 per insurance rep, but could not confirm with confidence. However this option is not going to get her diabetes to goal.  - Declines starting on insulin at this time - Last OV w/ PCP  sent increased metformin  dose to pharmacy (1000mg  twice daily) - Given A1c >9%/FBG >250. I think glipizide  at least 5mg  BID would be reasonable to add on if patient is agreeable. This would be $10 through her insurance. I attempted to call to review this as a potential option, and would want to further review general expectations - how to take, side effects, etc.,  Follow Up Plan: 1 wk check in call  06/29/2023 - Counseled patient regarding starting on glipizide  5mg  twice daily. Understands to take before a meal twice daily, risk of low blood sugar.  - Confirmed there is no copay issues with glipizide . Patient reviewed with pharmacy app while on phone with me and is less than $9. Agreeable to starting on this new rx in addition to metformin  1000mg  Bid, which was started 05/2023. - Also discussed high copays of newer medications with patient    07/20/2023 - Reviewed side effects that could occur w/ glipizide  - denies any issues with 5mg  BID dosing, no  missed doses noted. Continues on metformin  1000mg  BID - Had not started testing BG yet, however recently picked up a glucose meter. Reviewed instructions for testing. Plans to start testing BG today. Note's is familiar with testing d/t watching Grandma test her sugars. Upcoming PCP appt next week. Will jot done numbers and time as she starts testing.     Rolando Cliche, PharmD, BCGP Clinical Pharmacist  [phone]

## 2023-07-20 NOTE — Progress Notes (Signed)
   07/20/2023  Patient ID: Megan Blackwell, female   DOB: 08/18/1995, 28 y.o.   MRN: [id number]  Attempted to contact patient for scheduled appointment for medication management. Left HIPAA compliant message for patient to return my call at their convenience.   --returned call, visit completed.  Rolando Cliche, PharmD, BCGP Clinical Pharmacist  [phone]

## 2023-07-26 ENCOUNTER — Ambulatory Visit: Payer: PRIVATE HEALTH INSURANCE | Admitting: Physician Assistant

## 2023-08-07 ENCOUNTER — Ambulatory Visit: Payer: PRIVATE HEALTH INSURANCE | Admitting: Nutrition

## 2023-08-14 ENCOUNTER — Ambulatory Visit: Payer: PRIVATE HEALTH INSURANCE | Admitting: Physician Assistant

## 2023-08-14 VITALS — BP 118/80 | HR 86 | Temp 97.2°F | Ht 65.0 in | Wt 283.0 lb

## 2023-08-14 DIAGNOSIS — E1165 Type 2 diabetes mellitus with hyperglycemia: Secondary | ICD-10-CM

## 2023-08-14 MED ORDER — TIRZEPATIDE 5 MG/0.5ML ~~LOC~~ SOAJ: 5.0000 mg | SUBCUTANEOUS | 0 refills | Status: DC

## 2023-08-14 MED ORDER — TIRZEPATIDE 2.5 MG/0.5ML ~~LOC~~ SOAJ: 2.5000 mg | SUBCUTANEOUS | 0 refills | Status: DC

## 2023-08-14 MED ORDER — TIRZEPATIDE 7.5 MG/0.5ML ~~LOC~~ SOAJ: 7.5000 mg | SUBCUTANEOUS | 0 refills | Status: DC

## 2023-08-14 MED ORDER — FREESTYLE LIBRE 3 SENSOR MISC: 1.0000 | 3 refills | Status: DC

## 2023-08-14 NOTE — Assessment & Plan Note (Addendum)
 Not Controlled. Hyperglycemia. Continue current management. Adding Mounjaro. Savings card given to patient today.  A1c today.  Freestyle libre sample given today for blood sugar monitoring.  Lipid panel checked less than a year ago. Increase dietary efforts and physical activity. Patient schedule to see endocrinology at the end of July. She follows with the pharmacy team and our diabetic educator.  Routine diabetic retinopathy screening: overdue, working on scheduling. Foot exam and monofilament test done.

## 2023-08-14 NOTE — Progress Notes (Signed)
 Established Patient Office Visit  Subjective   Patient ID: Megan Blackwell, female    DOB: Nov 22, 1995  Age: 28 y.o. MRN: [id number]  Chief Complaint  Patient presents with   1 month follow up    Medication follow - has not started sitagliptan or sertraline     Patient presents today for follow up regarding diabetes. She has since been seen by the diabetic educator and the pharmacy team, she is schedule to see endocrinology in July. She has no concerns or complaints today. She relates compliance with metformin  and glipizide . She states she does not check sugars often at home, but when she does readings range from 150-250. She relates little interest in insulin at this time, but would be open to a once weekly injectable medication if she can get it covered by insurance. She does not have concerns or complaints today.      Review of Systems  Constitutional:  Negative for fever, malaise/fatigue and weight loss.  Respiratory:  Negative for shortness of breath.   Cardiovascular:  Negative for chest pain.  Gastrointestinal:  Negative for nausea and vomiting.  Neurological:  Negative for dizziness and headaches.      Objective:     BP 118/80   Pulse 86   Temp (!) 97.2 F (36.2 C)   Ht 5' 5 (1.651 m)   Wt 283 lb (128.4 kg)   SpO2 97%   BMI 47.09 kg/m    Physical Exam Constitutional:      Appearance: Normal appearance. She is obese.  HENT:     Head: Normocephalic.     Mouth/Throat:     Mouth: Mucous membranes are moist.     Pharynx: Oropharynx is clear.   Eyes:     Extraocular Movements: Extraocular movements intact.     Conjunctiva/sclera: Conjunctivae normal.    Cardiovascular:     Rate and Rhythm: Normal rate and regular rhythm.     Heart sounds: Normal heart sounds. No murmur heard. Pulmonary:     Effort: Pulmonary effort is normal.     Breath sounds: Normal breath sounds. No wheezing or rales.   Musculoskeletal:     Right lower leg: No edema.     Left lower  leg: No edema.   Skin:    General: Skin is warm and dry.   Neurological:     General: No focal deficit present.     Mental Status: She is alert and oriented to person, place, and time.   Psychiatric:        Mood and Affect: Mood normal.        Behavior: Behavior normal.      No results found for any visits on 08/14/23.  The ASCVD Risk score (Arnett DK, et al., 2019) failed to calculate for the following reasons:   The 2019 ASCVD risk score is only valid for ages 96 to 50    Assessment & Plan:   Return in about 3 months (around 11/14/2023) for diabetes .   Type 2 diabetes mellitus with hyperglycemia, without long-term current use of insulin (HCC) Assessment & Plan: Not Controlled. Hyperglycemia. Continue current management. Adding Mounjaro. Savings card given to patient today.  A1c today.  Freestyle libre sample given today for blood sugar monitoring.  Lipid panel checked less than a year ago. Increase dietary efforts and physical activity. Patient schedule to see endocrinology at the end of July. She follows with the pharmacy team and our diabetic educator.  Routine diabetic retinopathy screening: overdue,  working on scheduling. Foot exam and monofilament test done.    Orders: -     FreeStyle Libre 3 Sensor; 1 Device by Does not apply route every 14 (fourteen) days. Place 1 sensor on the skin every 14 days. Use to check glucose continuously  Dispense: 2 each; Refill: 3 -     Tirzepatide; Inject 2.5 mg into the skin once a week.  Dispense: 2 mL; Refill: 0 -     Tirzepatide; Inject 5 mg into the skin once a week.  Dispense: 6 mL; Refill: 0 -     Tirzepatide; Inject 7.5 mg into the skin once a week.  Dispense: 6 mL; Refill: 0 -     Hemoglobin A1c    Archit Leger, PA-C

## 2023-08-21 ENCOUNTER — Ambulatory Visit: Payer: PRIVATE HEALTH INSURANCE | Admitting: Nutrition

## 2023-08-24 ENCOUNTER — Telehealth: Payer: Self-pay

## 2023-08-24 ENCOUNTER — Other Ambulatory Visit: Payer: PRIVATE HEALTH INSURANCE

## 2023-08-24 NOTE — Progress Notes (Signed)
   08/24/2023  Patient ID: Megan Blackwell, female   DOB: 08/25/1995, 28 y.o.   MRN: [id number]  Attempted to contact patient for scheduled appointment for medication management. Left HIPAA compliant message for patient to return my call at their convenience.   Also did reach out to patients insurance in advance to review insurance coverage given recently rx'd mounjaro. All brand name medications are tier 2 or 3 and require 100% patient responsibility so essentially paying cash price. Tier exceptions are not allowed with this plan either.   Lang Sieve, PharmD, BCGP Clinical Pharmacist  [phone]

## 2023-09-11 ENCOUNTER — Encounter: Payer: PRIVATE HEALTH INSURANCE | Admitting: Nutrition

## 2023-09-17 ENCOUNTER — Other Ambulatory Visit: Payer: Self-pay | Admitting: Physician Assistant

## 2023-09-18 ENCOUNTER — Other Ambulatory Visit: Payer: Self-pay

## 2023-09-18 MED ORDER — ALBUTEROL SULFATE HFA 108 (90 BASE) MCG/ACT IN AERS: 1.0000 | INHALATION_SPRAY | Freq: Four times a day (QID) | RESPIRATORY_TRACT | 1 refills | Status: DC | PRN

## 2023-09-25 ENCOUNTER — Ambulatory Visit: Payer: Self-pay | Admitting: Nurse Practitioner

## 2023-10-09 NOTE — Patient Instructions (Incomplete)

## 2023-10-11 ENCOUNTER — Ambulatory Visit: Payer: PRIVATE HEALTH INSURANCE | Admitting: Nurse Practitioner

## 2023-10-11 DIAGNOSIS — E782 Mixed hyperlipidemia: Secondary | ICD-10-CM

## 2023-10-11 DIAGNOSIS — E1165 Type 2 diabetes mellitus with hyperglycemia: Secondary | ICD-10-CM

## 2023-10-11 DIAGNOSIS — Z7984 Long term (current) use of oral hypoglycemic drugs: Secondary | ICD-10-CM

## 2023-10-11 NOTE — Progress Notes (Deleted)
 Endocrinology Consult Note       10/11/2023, 7:25 AM   Subjective:    Patient ID: Megan Blackwell, female    DOB: 03-07-1995.  TYANNAH SANE is being seen in consultation for management of currently uncontrolled symptomatic diabetes requested by  Grooms, [place], [place].   Past Medical History:  Diagnosis Date   Abnormal liver ultrasound    Asthma    Diabetes mellitus without complication (HCC)    Mental disorder    Migraines    Obesity    PCO (polycystic ovaries)     Past Surgical History:  Procedure Laterality Date   ADENOIDECTOMY     FOOT SURGERY     TONSILLECTOMY      Social History   Socioeconomic History   Marital status: Single    Spouse name: Not on file   Number of children: Not on file   Years of education: Not on file   Highest education level: Some college, no degree  Occupational History   Not on file  Tobacco Use   Smoking status: Never    Passive exposure: Yes   Smokeless tobacco: Never  Vaping Use   Vaping status: Never Used  Substance and Sexual Activity   Alcohol use: Yes    Comment: socially   Drug use: No   Sexual activity: Not Currently    Birth control/protection: None, Abstinence  Other Topics Concern   Not on file  Social History Narrative   Not on file   Social Drivers of Health   Financial Resource Strain: Low Risk  (08/14/2023)   Overall Financial Resource Strain (CARDIA)    Difficulty of Paying Living Expenses: Not hard at all  Food Insecurity: No Food Insecurity (08/14/2023)   Hunger Vital Sign    Worried About Running Out of Food in the Last Year: Never true    Ran Out of Food in the Last Year: Never true  Transportation Needs: No Transportation Needs (08/14/2023)   PRAPARE - Administrator, Civil Service (Medical): No    Lack of Transportation (Non-Medical): No  Physical Activity: Insufficiently Active (08/14/2023)   Exercise Vital Sign     Days of Exercise per Week: 4 days    Minutes of Exercise per Session: 30 min  Stress: No Stress Concern Present (08/14/2023)   [employer] of Occupational Health - Occupational Stress Questionnaire    Feeling of Stress: Only a little  Recent Concern: Stress - Stress Concern Present (06/09/2023)   [employer] of Occupational Health - Occupational Stress Questionnaire    Feeling of Stress : To some extent  Social Connections: Moderately Isolated (08/14/2023)   Social Connection and Isolation Panel    Frequency of Communication with Friends and Family: More than three times a week    Frequency of Social Gatherings with Friends and Family: More than three times a week    Attends Religious Services: 1 to 4 times per year    Active Member of [employer] or Organizations: No    Attends Engineer, structural: Not on file    Marital Status: Never married    Family History  Problem Relation Age of Onset   Diabetes  Maternal Grandmother    Cancer Maternal Grandfather    Diabetes Maternal Grandfather    Diabetes Mother    Osteoporosis Mother    Migraines Sister     Outpatient Encounter Medications as of 10/11/2023  Medication Sig   acetaminophen (TYLENOL) 650 MG CR tablet    albuterol (VENTOLIN HFA) 108 (90 Base) MCG/ACT inhaler Inhale 1-2 puffs into the lungs every 6 (six) hours as needed for wheezing or shortness of breath.   clindamycin (CLEOCIN T) 1 % external solution Apply topically 2 (two) times daily.   Continuous Glucose Sensor (FREESTYLE LIBRE 3 SENSOR) MISC 1 Device by Does not apply route every 14 (fourteen) days. Place 1 sensor on the skin every 14 days. Use to check glucose continuously   glipiZIDE (GLUCOTROL) 5 MG tablet Take 1 tablet (5 mg total) by mouth 2 (two) times daily before a meal.   ibuprofen (ADVIL,MOTRIN) 200 MG tablet Take 200 mg by mouth as needed.   Melatonin 5 MG TABS Take 10 mg by mouth as needed.    metFORMIN (GLUCOPHAGE) 1000 MG tablet Take 1 tablet  (1,000 mg total) by mouth 2 (two) times daily with a meal.   tirzepatide (MOUNJARO) 2.5 MG/0.5ML Pen Inject 2.5 mg into the skin once a week.   tirzepatide (MOUNJARO) 5 MG/0.5ML Pen Inject 5 mg into the skin once a week.   tirzepatide (MOUNJARO) 7.5 MG/0.5ML Pen Inject 7.5 mg into the skin once a week.   No facility-administered encounter medications on file as of 10/11/2023.    ALLERGIES: No Known Allergies  VACCINATION STATUS:  There is no immunization history on file for this patient.  Diabetes She presents for her initial diabetic visit. She has type 2 diabetes mellitus. Her disease course has been fluctuating. Hypoglycemia symptoms include nervousness/anxiousness, sweats and tremors. There are no diabetic associated symptoms. There are no hypoglycemic complications. There are no diabetic complications. Risk factors for coronary artery disease include diabetes mellitus, obesity, dyslipidemia and sedentary lifestyle. Current diabetic treatment includes oral agent (dual therapy). She is compliant with treatment most of the time. Her weight is fluctuating minimally. She is following a generally unhealthy diet. When asked about meal planning, she reported none. She has not had a previous visit with a dietitian. She participates in exercise intermittently. An ACE inhibitor/angiotensin II receptor blocker is not being taken. She does not see a podiatrist.Eye exam is current.     Review of systems  Constitutional: + Minimally fluctuating body weight, current There is no height or weight on file to calculate BMI., no fatigue, no subjective hyperthermia, no subjective hypothermia Eyes: no blurry vision, no xerophthalmia ENT: no sore throat, no nodules palpated in throat, no dysphagia/odynophagia, no hoarseness Cardiovascular: no chest pain, no shortness of breath, no palpitations, no leg swelling Respiratory: no cough, no shortness of breath Gastrointestinal: no  nausea/vomiting/diarrhea Musculoskeletal: no muscle/joint aches Skin: no rashes, no hyperemia Neurological: no tremors, no numbness, no tingling, no dizziness Psychiatric: no depression, no anxiety  Objective:     There were no vitals taken for this visit.  Wt Readings from Last 3 Encounters:  08/14/23 283 lb (128.4 kg)  07/10/23 290 lb 12.8 oz (131.9 kg)  06/13/23 288 lb (130.6 kg)     BP Readings from Last 3 Encounters:  08/14/23 118/80  06/13/23 119/82  05/01/23 134/84     Physical Exam- Limited  Constitutional:  There is no height or weight on file to calculate BMI. , not in acute distress, normal state of  mind Eyes:  EOMI, no exophthalmos Neck: Supple Cardiovascular: RRR, no murmurs, rubs, or gallops, no edema Respiratory: Adequate breathing efforts, no crackles, rales, rhonchi, or wheezing Musculoskeletal: no gross deformities, strength intact in all four extremities, no gross restriction of joint movements Skin:  no rashes, no hyperemia Neurological: no tremor with outstretched hands   Diabetic Foot Exam - Simple   No data filed      CMP ( most recent) CMP     Component Value Date/Time   NA 136 06/13/2023 1006   K 4.3 06/13/2023 1006   CL 101 06/13/2023 1006   CO2 18 (L) 06/13/2023 1006   GLUCOSE 328 (H) 06/13/2023 1006   GLUCOSE 82 06/04/2013 1155   BUN 11 06/13/2023 1006   CREATININE 0.45 (L) 06/13/2023 1006   CALCIUM 9.3 06/13/2023 1006   PROT 6.9 06/13/2023 1006   ALBUMIN 4.2 06/13/2023 1006   AST 20 06/13/2023 1006   ALT 41 (H) 06/13/2023 1006   ALKPHOS 96 06/13/2023 1006   BILITOT 0.4 06/13/2023 1006   EGFR 134 06/13/2023 1006   GFRNONAA >90 06/04/2013 1155     Diabetic Labs (most recent): Lab Results  Component Value Date   HGBA1C 11.9 (H) 05/01/2023     Lipid Panel ( most recent) Lipid Panel     Component Value Date/Time   CHOL 186 06/13/2023 1006   TRIG 161 (H) 06/13/2023 1006   HDL 34 (L) 06/13/2023 1006   CHOLHDL 5.5 (H)  06/13/2023 1006   LDLCALC 123 (H) 06/13/2023 1006   LABVLDL 29 06/13/2023 1006      Lab Results  Component Value Date   TSH 2.820 05/01/2023           Assessment & Plan:   1) Type 2 diabetes mellitus with hyperglycemia, without long-term current use of insulin (HCC) (Primary)    - Prescilla CHRISTELLA Side has currently uncontrolled symptomatic type 2 DM since *** years of age, with most recent A1c of *** %.   -Recent labs reviewed.  - I had a long discussion with her about the progressive nature of diabetes and the pathology behind its complications. -her diabetes is not currently complicated but she remains at a high risk for more acute and chronic complications which include CAD, CVA, CKD, retinopathy, and neuropathy. These are all discussed in detail with her.  The following Lifestyle Medicine recommendations according to American College of Lifestyle Medicine [facility]) were discussed and offered to patient and she agrees to start the journey:  A. Whole Foods, Plant-based plate comprising of fruits and vegetables, plant-based proteins, whole-grain carbohydrates was discussed in detail with the patient.   A list for source of those nutrients were also provided to the patient.  Patient will use only water or unsweetened tea for hydration. B.  The need to stay away from risky substances including alcohol, smoking; obtaining 7 to 9 hours of restorative sleep, at least 150 minutes of moderate intensity exercise weekly, the importance of healthy social connections,  and stress reduction techniques were discussed. C.  A full color page of  Calorie density of various food groups per pound showing examples of each food groups was provided to the patient.  - I have counseled her on diet and weight management by adopting a carbohydrate restricted/protein rich diet. Patient is encouraged to switch to unprocessed or minimally processed complex starch and increased protein intake (animal or plant source),  fruits, and vegetables. -  she is advised to stick to a routine mealtimes  to eat 3 meals a day and avoid unnecessary snacks (to snack only to correct hypoglycemia).   - she acknowledges that there is a room for improvement in her food and drink choices. - Suggestion is made for her to avoid simple carbohydrates from her diet including Cakes, Sweet Desserts, Ice Cream, Soda (diet and regular), Sweet Tea, Candies, Chips, Cookies, Store Bought Juices, Alcohol in Excess of 1-2 drinks a day, Artificial Sweeteners, Coffee Creamer, and Sugar-free Products. This will help patient to have more stable blood glucose profile and potentially avoid unintended weight gain.  - I have approached her with the following individualized plan to manage her diabetes and patient agrees:    -she is encouraged to start/continue monitoring glucose 2 times daily, before breakfast and before bed, and to call the clinic if she has readings less than 70 or above 300 for 3 tests in a row.  - Adjustment parameters are given to her for hypo and hyperglycemia in writing.  - she is advised to continue ***, therapeutically suitable for patient . - her *** will be discontinued, risk outweighs benefit for this patient.  - she will be considered for incretin therapy as appropriate next visit.  - Specific targets for  A1c; LDL, HDL, and Triglycerides were discussed with the patient.  2) Blood Pressure /Hypertension:  her blood pressure is controlled to target without the use of antihypertensive medications.  She will be considered for ACE/ARB if BP elevated on 3 separate occasions.   3) Lipids/Hyperlipidemia:    Review of her recent lipid panel from 06/13/23 showed uncontrolled LDL at 123 and elevated triglycerides of 161.  She does not appear to be on any lipid lowering medications at this time.    4)  Weight/Diet:  her There is no height or weight on file to calculate BMI.  -  clearly complicating her diabetes care.   she is a  candidate for weight loss. I discussed with her the fact that loss of 5 - 10% of her  current body weight will have the most impact on her diabetes management.  Exercise, and detailed carbohydrates information provided  -  detailed on discharge instructions.  5) Chronic Care/Health Maintenance: -she is not on ACEI/ARB or Statin medications and is encouraged to initiate and continue to follow up with Ophthalmology, Dentist, Podiatrist at least yearly or according to recommendations, and advised to stay away from smoking. I have recommended yearly flu vaccine and pneumonia vaccine at least every 5 years; moderate intensity exercise for up to 150 minutes weekly; and sleep for at least 7 hours a day.  - she is advised to maintain close follow up with Grooms, Charmaine, PA-C for primary care needs, as well as her other providers for optimal and coordinated care.   - Time spent in this patient care: 60 min, which was spent in counseling her about her diabetes and the rest reviewing her blood glucose logs, discussing her hypoglycemia and hyperglycemia episodes, reviewing her current and previous labs/studies (including abstraction from other facilities) and medications doses and developing a long term treatment plan based on the latest standards of care/guidelines; and documenting her care.    Please refer to Patient Instructions for Blood Glucose Monitoring and Insulin/Medications Dosing Guide in media tab for additional information. Please also refer to Patient Self Inventory in the Media tab for reviewed elements of pertinent patient history.  Prescilla CHRISTELLA Side participated in the discussions, expressed understanding, and voiced agreement with the above plans.  All  questions were answered to her satisfaction. she is encouraged to contact clinic should she have any questions or concerns prior to her return visit.     Follow up plan: - No follow-ups on file.    Benton Rio, [facility]  [facility] [address] [place], [place] [address] Phone: [phone] Fax: [phone]  10/11/2023, 7:25 AM

## 2023-11-15 ENCOUNTER — Ambulatory Visit: Payer: PRIVATE HEALTH INSURANCE | Admitting: Physician Assistant

## 2023-12-15 ENCOUNTER — Encounter: Payer: Self-pay | Admitting: Women's Health

## 2023-12-19 ENCOUNTER — Encounter: Payer: Self-pay | Admitting: Adult Health

## 2023-12-19 ENCOUNTER — Other Ambulatory Visit (HOSPITAL_COMMUNITY)
Admission: RE | Admit: 2023-12-19 | Discharge: 2023-12-19 | Disposition: A | Discharge status: Home / Self Care | Payer: PRIVATE HEALTH INSURANCE | Source: Ambulatory Visit | Attending: Adult Health | Admitting: Adult Health

## 2023-12-19 ENCOUNTER — Ambulatory Visit (INDEPENDENT_AMBULATORY_CARE_PROVIDER_SITE_OTHER): Payer: PRIVATE HEALTH INSURANCE | Admitting: Adult Health

## 2023-12-19 VITALS — BP 118/87 | HR 83 | Ht 65.0 in | Wt 284.0 lb

## 2023-12-19 DIAGNOSIS — E1165 Type 2 diabetes mellitus with hyperglycemia: Secondary | ICD-10-CM | POA: Diagnosis not present

## 2023-12-19 DIAGNOSIS — L732 Hidradenitis suppurativa: Secondary | ICD-10-CM

## 2023-12-19 DIAGNOSIS — N898 Other specified noninflammatory disorders of vagina: Secondary | ICD-10-CM | POA: Diagnosis present

## 2023-12-19 DIAGNOSIS — B369 Superficial mycosis, unspecified: Secondary | ICD-10-CM | POA: Diagnosis not present

## 2023-12-19 MED ORDER — FLUCONAZOLE 150 MG PO TABS: ORAL_TABLET | ORAL | 1 refills | Status: DC

## 2023-12-19 NOTE — Progress Notes (Signed)
 Subjective:     Patient ID: Megan Blackwell, female   DOB: 1996-02-14, 28 y.o.   MRN: [id number]  HPI Yanel is a 28 year old white female, with SO, G0P0, in complaining of vaginal irritation, with itching, more on outside, and rash started about 12/10/24. Has changed detergents. Last A1c was 11.9 in March.     Component Value Date/Time   DIAGPAP  01/24/2022 1540    - Negative for intraepithelial lesion or malignancy (NILM)   DIAGPAP  04/25/2019 1451    - Negative for intraepithelial lesion or malignancy (NILM)   DIAGPAP  07/07/2016 0000    NEGATIVE FOR INTRAEPITHELIAL LESIONS OR MALIGNANCY. BENIGN REACTIVE/REPARATIVE CHANGES.   HPVHIGH Negative 01/24/2022 1540   ADEQPAP  01/24/2022 1540    Satisfactory for evaluation; transformation zone component PRESENT.   ADEQPAP  04/25/2019 1451    Satisfactory for evaluation; transformation zone component PRESENT.   ADEQPAP  07/07/2016 0000    Satisfactory for evaluation  endocervical/transformation zone component PRESENT.    PCP is C Grooms PA. Review of Systems +vaginal irritation, with itching, more on outside, and rash started about 12/10/24.    Reviewed past medical,surgical, social and family history. Reviewed medications and allergies.  Objective:   Physical Exam BP 118/87 (BP Location: Right Arm, Patient Position: Sitting, Cuff Size: Large)   Pulse 83   Ht 5' 5 (1.651 m)   Wt 284 lb (128.8 kg)   LMP 11/28/2023 (Exact Date)   BMI 47.26 kg/m     Skin warm and dry.Pelvic: external genitalia, has scarring from HS, labia red, has white discharge in creases, vagina: white discharge without odor,urethra has no lesions or masses noted, cervix:smooth, uterus: normal size, shape and contour, non tender, no masses felt, adnexa: no masses or tenderness noted. Bladder is non tender and no masses felt. CV swab obtained .Painted labia with gentian violet.  Examination chaperoned by Clarita Salt LPN  Fall risk is low  Upstream - 12/19/23 0844        Pregnancy Intention Screening   Does the patient want to become pregnant in the next year? Yes    Does the patient's partner want to become pregnant in the next year? Yes    Would the patient like to discuss contraceptive options today? No      Contraception Wrap Up   Current Method Pregnant/Seeking Pregnancy    End Method Pregnant/Seeking Pregnancy    Contraception Counseling Provided No          Assessment:     1. Itching in the vaginal area (Primary) +itching since about 12/11/23 CV swab sent for BV and yeast - Cervicovaginal ancillary only( [place])  2. Superficial fungus infection of skin Labia red Painted with gentian violet and will rx diflucan  Meds ordered this encounter  Medications   fluconazole  (DIFLUCAN ) 150 MG tablet    Sig: Take 1 now and 1 in 3 days    Dispense:  2 tablet    Refill:  1    Supervising Provider:   JAYNE MINDER H [[id number]]   No sex til Sunday   3. Hidradenitis  4. Type 2 diabetes mellitus with hyperglycemia, without long-term current use of insulin (HCC) Follow up with PCP, to get A1c checked Yeast likes sugar, and she is not sure what blood sugar is ha snot checked at home Try to get A1c around 7 before getting pregnant,  5. Vaginal discharge +white discharge, no odor CV swab sent for BV and yeast, declines STD  testing  - Cervicovaginal ancillary only( [place])     Plan:     Follow up on 12/28/23 for recheck  Try to get A1c checked before then, she owes [facility] money, so ask PCP to place order to [facility], you need to know your numbers

## 2023-12-20 ENCOUNTER — Ambulatory Visit: Payer: Self-pay | Admitting: Adult Health

## 2023-12-20 LAB — CERVICOVAGINAL ANCILLARY ONLY
Bacterial Vaginitis (gardnerella): POSITIVE — AB
Candida Glabrata: POSITIVE — AB
Candida Vaginitis: POSITIVE — AB
Comment: NEGATIVE
Comment: NEGATIVE
Comment: NEGATIVE

## 2023-12-20 MED ORDER — METRONIDAZOLE 500 MG PO TABS: 500.0000 mg | ORAL_TABLET | Freq: Two times a day (BID) | ORAL | 0 refills | Status: DC

## 2023-12-28 ENCOUNTER — Ambulatory Visit: Payer: PRIVATE HEALTH INSURANCE | Admitting: Adult Health

## 2023-12-28 VITALS — BP 130/79 | HR 94 | Ht 65.0 in | Wt 284.0 lb

## 2023-12-28 DIAGNOSIS — L732 Hidradenitis suppurativa: Secondary | ICD-10-CM | POA: Diagnosis not present

## 2023-12-28 DIAGNOSIS — B369 Superficial mycosis, unspecified: Secondary | ICD-10-CM | POA: Diagnosis not present

## 2023-12-28 DIAGNOSIS — N898 Other specified noninflammatory disorders of vagina: Secondary | ICD-10-CM

## 2023-12-28 NOTE — Progress Notes (Signed)
  Subjective:     Patient ID: Megan Blackwell, female   DOB: February 04, 1996, 28 y.o.   MRN: [id number]  HPI Megan Blackwell is a 28 year old white female, with SO, G0P0, back in follow up on being treated for vaginal itching and skin fungus, and she is feeling much better, she was also treated for BV, when CV swab resulted.     Component Value Date/Time   DIAGPAP  01/24/2022 1540    - Negative for intraepithelial lesion or malignancy (NILM)   DIAGPAP  04/25/2019 1451    - Negative for intraepithelial lesion or malignancy (NILM)   DIAGPAP  07/07/2016 0000    NEGATIVE FOR INTRAEPITHELIAL LESIONS OR MALIGNANCY. BENIGN REACTIVE/REPARATIVE CHANGES.   HPVHIGH Negative 01/24/2022 1540   ADEQPAP  01/24/2022 1540    Satisfactory for evaluation; transformation zone component PRESENT.   ADEQPAP  04/25/2019 1451    Satisfactory for evaluation; transformation zone component PRESENT.   ADEQPAP  07/07/2016 0000    Satisfactory for evaluation  endocervical/transformation zone component PRESENT.    PCP is Charmaine Grooms PA Review of Systems Itching has resolved Rash has resolved No vaginal discharge Reviewed past medical,surgical, social and family history. Reviewed medications and allergies.     Objective:   Physical Exam BP 130/79 (BP Location: Right Arm, Patient Position: Sitting, Cuff Size: Large)   Pulse 94   Ht 5' 5 (1.651 m)   Wt 284 lb (128.8 kg)   LMP 11/28/2023 (Exact Date)   BMI 47.26 kg/m     Skin warm and dry.Pelvic: external genitalia is normal in appearance, +scarring from HS, skin fungus has resolved,vagina: pink, no odor or discharge,urethra has no lesions or masses noted, cervix:smooth, uterus: normal size, shape and contour, non tender, no masses felt, adnexa: no masses or tenderness noted. Bladder is non tender and no masses felt.   Up[facility] - 12/28/23 1358       Pregnancy Intention Screening   Does the patient want to become pregnant in the next year? Yes    Does the patient's  partner want to become pregnant in the next year? Yes    Would the patient like to discuss contraceptive options today? No      Contraception Wrap Up   Current Method Pregnant/Seeking Pregnancy    End Method Pregnant/Seeking Pregnancy    Contraception Counseling Provided No         Examination chaperoned by Clarita Salt LPN  Assessment:     1. Itching in the vaginal area (Primary) Resolved, has refill on diflucan  if needed   2. Superficial fungus infection of skin Resolved, has refill on diflucan  if needed Aware that can paint with gentian violet again if needed later on Get blood sugars controlled  3. Hidradenitis     Plan:     Follow up prn

## 2024-01-01 ENCOUNTER — Ambulatory Visit (INDEPENDENT_AMBULATORY_CARE_PROVIDER_SITE_OTHER): Payer: PRIVATE HEALTH INSURANCE | Admitting: Physician Assistant

## 2024-01-01 ENCOUNTER — Encounter: Payer: Self-pay | Admitting: Physician Assistant

## 2024-01-01 VITALS — BP 123/83 | HR 91 | Temp 97.5°F | Ht 65.0 in | Wt 279.0 lb

## 2024-01-01 DIAGNOSIS — E1165 Type 2 diabetes mellitus with hyperglycemia: Secondary | ICD-10-CM | POA: Diagnosis not present

## 2024-01-01 DIAGNOSIS — Z7984 Long term (current) use of oral hypoglycemic drugs: Secondary | ICD-10-CM

## 2024-01-01 MED ORDER — GLIPIZIDE 5 MG PO TABS: 5.0000 mg | ORAL_TABLET | Freq: Two times a day (BID) | ORAL | 3 refills | Status: AC

## 2024-01-01 MED ORDER — ALBUTEROL SULFATE HFA 108 (90 BASE) MCG/ACT IN AERS: 1.0000 | INHALATION_SPRAY | Freq: Four times a day (QID) | RESPIRATORY_TRACT | 1 refills | Status: AC | PRN

## 2024-01-01 MED ORDER — METFORMIN HCL 1000 MG PO TABS: 1000.0000 mg | ORAL_TABLET | Freq: Two times a day (BID) | ORAL | 1 refills | Status: AC

## 2024-01-01 NOTE — Progress Notes (Signed)
   Subjective:    Patient ID: Megan Blackwell, female    DOB: 1995/03/31, 28 y.o.   MRN: [id number]  HPI  Follow up blood sugars - no concerns  Review of Systems     Objective:   Physical Exam        Assessment & Plan:

## 2024-01-01 NOTE — Assessment & Plan Note (Signed)
 Not Controlled. Hyperglycemia. Has not been checking sugars at home recently.  Continue current management. Upcoming endocrinology appointment.  Blood pressure is well controlled today.  Lipid panel checked less than a year ago. Urine ACR, A1c, CMP, and CBC today.   Increase dietary efforts and physical activity. Routine diabetic retinopathy screening: never done, patient to schedule. Foot exam and monofilament test done today.   Concern for non-compliance and non-adherence. Financial constraints play a role.

## 2024-01-01 NOTE — Progress Notes (Signed)
 Established Patient Office Visit  Subjective   Patient ID: Megan Blackwell, female    DOB: [birth year]  Age: 28 y.o. MRN: [id number]  No chief complaint on file.   Discussed the use of AI scribe software for clinical note transcription with the patient, who gave verbal consent to proceed.  History of Present Illness Megan Blackwell is a 28 year old female with diabetes who presents for follow-up on blood sugar management and sleep disturbances.  Patient currently on metformin  and glipizide  for management of blood sugars. She has not used her glucose monitors for two weeks due to financial constraints. Previously, her blood sugars were mostly under 230, with about 50% between 100 to 180 mg/dL. She was unable to obtain the injectable medication due to cost, and has previously refused insulin therapy. An endocrinology appointment is scheduled for tomorrow. Last A1c in March was 11.9.  She has not seen an eye doctor recently and is unsure about insurance coverage for eye and dental care. No numbness, tingling, or sores on her feet. Her diet includes smaller portions, with reduced sweets and carbohydrates. She has previously seen the diabetic dietician however, has failed to follow up and therefore has not made many diet changes since diagnosis. She relates increased physical activity, relating she has been hiking every other weekend.   She experiences disrupted sleep, waking at midnight, 3 AM, and 5 AM, but falls back asleep for short periods. She uses melatonin and maintains a consistent bedtime routine, aiming to sleep by 10 or 10:30 PM.    Review of Systems  Constitutional:  Negative for chills, fever and malaise/fatigue.  Eyes:  Negative for blurred vision and double vision.  Respiratory:  Negative for cough and shortness of breath.   Cardiovascular:  Negative for chest pain and palpitations.  Neurological:  Negative for dizziness, tingling, sensory change and headaches.   Psychiatric/Behavioral:  Negative for depression. The patient has insomnia. The patient is not nervous/anxious.       Objective:     BP 123/83   Pulse 91   Temp (!) 97.5 F (36.4 C)   Ht 5' 5 (1.651 m)   Wt 279 lb (126.6 kg)   LMP 11/28/2023 (Exact Date)   SpO2 98%   BMI 46.43 kg/m    Physical Exam Constitutional:      General: She is not in acute distress.    Appearance: Normal appearance. She is obese. She is not ill-appearing.  HENT:     Head: Normocephalic and atraumatic.     Mouth/Throat:     Mouth: Mucous membranes are moist.     Pharynx: Oropharynx is clear.  Eyes:     Extraocular Movements: Extraocular movements intact.     Conjunctiva/sclera: Conjunctivae normal.  Cardiovascular:     Rate and Rhythm: Normal rate and regular rhythm.     Heart sounds: Normal heart sounds. No murmur heard. Pulmonary:     Effort: Pulmonary effort is normal.     Breath sounds: Normal breath sounds.  Musculoskeletal:     Right foot: No deformity.     Left foot: No deformity.  Feet:     Right foot:     Protective Sensation: 5 sites tested.  5 sites sensed.     Skin integrity: No ulcer, blister, erythema or warmth.     Toenail Condition: Right toenails are normal.     Left foot:     Protective Sensation: 5 sites tested.  5 sites sensed.  Skin integrity: No ulcer, blister, erythema or warmth.     Toenail Condition: Left toenails are normal.  Skin:    General: Skin is warm and dry.  Neurological:     General: No focal deficit present.     Mental Status: She is alert and oriented to person, place, and time.  Psychiatric:        Mood and Affect: Mood normal.        Behavior: Behavior normal.     No results found for any visits on 01/01/24.  The ASCVD Risk score (Arnett DK, et al., 2019) failed to calculate for the following reasons:   The 2019 ASCVD risk score is only valid for ages 9 to 3    Assessment & Plan:   Return in about 6 months (around 06/30/2024).    Type 2 diabetes mellitus with hyperglycemia, without long-term current use of insulin (HCC) Assessment & Plan: Not Controlled. Hyperglycemia. Has not been checking sugars at home recently.  Continue current management. Upcoming endocrinology appointment.  Blood pressure is well controlled today.  Lipid panel checked less than a year ago. Urine ACR, A1c, CMP, and CBC today.   Increase dietary efforts and physical activity. Routine diabetic retinopathy screening: never done, patient to schedule. Foot exam and monofilament test done today.   Concern for non-compliance and non-adherence. Financial constraints play a role.   Orders: -     Hemoglobin A1c -     Microalbumin / creatinine urine ratio -     Comprehensive metabolic panel with GFR -     CBC with Differential/Platelet -     glipiZIDE ; Take 1 tablet (5 mg total) by mouth 2 (two) times daily before a meal.  Dispense: 60 tablet; Refill: 3 -     metFORMIN  HCl; Take 1 tablet (1,000 mg total) by mouth 2 (two) times daily with a meal.  Dispense: 180 tablet; Refill: 1  Other orders -     Albuterol  Sulfate HFA; Inhale 1 puff into the lungs every 6 (six) hours as needed for wheezing or shortness of breath.  Dispense: 8 g; Refill: 1    [place] N[place], PA-C

## 2024-01-02 ENCOUNTER — Encounter: Payer: Self-pay | Admitting: Nurse Practitioner

## 2024-01-02 ENCOUNTER — Ambulatory Visit: Payer: PRIVATE HEALTH INSURANCE | Admitting: Nurse Practitioner

## 2024-01-02 VITALS — BP 108/72 | HR 90 | Ht 65.0 in | Wt 282.2 lb

## 2024-01-02 DIAGNOSIS — E1165 Type 2 diabetes mellitus with hyperglycemia: Secondary | ICD-10-CM | POA: Diagnosis not present

## 2024-01-02 DIAGNOSIS — Z7984 Long term (current) use of oral hypoglycemic drugs: Secondary | ICD-10-CM | POA: Diagnosis not present

## 2024-01-02 MED ORDER — OZEMPIC (0.25 OR 0.5 MG/DOSE) 2 MG/3ML ~~LOC~~ SOPN: PEN_INJECTOR | SUBCUTANEOUS | 1 refills | Status: AC

## 2024-01-02 NOTE — Progress Notes (Signed)
 Endocrinology Consult Note       01/02/2024, 4:13 PM   Subjective:    Patient ID: Megan Blackwell, female    DOB: May 17, 1995.  Megan Blackwell is being seen in consultation for management of currently uncontrolled symptomatic diabetes requested by  Grooms, [place], [place].   Past Medical History:  Diagnosis Date   Abnormal liver ultrasound    Asthma    Diabetes mellitus without complication (HCC)    Mental disorder    Migraines    Obesity    PCO (polycystic ovaries)     Past Surgical History:  Procedure Laterality Date   ADENOIDECTOMY     FOOT SURGERY     TONSILLECTOMY      Social History   Socioeconomic History   Marital status: Significant Other    Spouse name: Not on file   Number of children: Not on file   Years of education: Not on file   Highest education level: Some college, no degree  Occupational History   Not on file  Tobacco Use   Smoking status: Never    Passive exposure: Yes   Smokeless tobacco: Never  Vaping Use   Vaping status: Never Used  Substance and Sexual Activity   Alcohol use: Yes    Comment: socially   Drug use: No   Sexual activity: Yes    Birth control/protection: None  Other Topics Concern   Not on file  Social History Narrative   Not on file   Social Drivers of Health   Financial Resource Strain: Low Risk  (12/28/2023)   Overall Financial Resource Strain (CARDIA)    Difficulty of Paying Living Expenses: Not hard at all  Food Insecurity: No Food Insecurity (12/28/2023)   Hunger Vital Sign    Worried About Running Out of Food in the Last Year: Never true    Ran Out of Food in the Last Year: Never true  Transportation Needs: No Transportation Needs (12/28/2023)   PRAPARE - Administrator, Civil Service (Medical): No    Lack of Transportation (Non-Medical): No  Physical Activity: Insufficiently Active (12/28/2023)   Exercise Vital Sign    Days  of Exercise per Week: 2 days    Minutes of Exercise per Session: 40 min  Stress: Stress Concern Present (12/28/2023)   [employer] of Occupational Health - Occupational Stress Questionnaire    Feeling of Stress: To some extent  Social Connections: Moderately Isolated (12/28/2023)   Social Connection and Isolation Panel    Frequency of Communication with Friends and Family: More than three times a week    Frequency of Social Gatherings with Friends and Family: Once a week    Attends Religious Services: 1 to 4 times per year    Active Member of [employer] or Organizations: No    Attends Engineer, Structural: Not on file    Marital Status: Never married    Family History  Problem Relation Age of Onset   Diabetes Maternal Grandmother    Cancer Maternal Grandfather    Diabetes Maternal Grandfather    Diabetes Mother    Osteoporosis Mother    Migraines Sister     Outpatient Encounter  Medications as of 01/02/2024  Medication Sig   acetaminophen  (TYLENOL ) 650 MG CR tablet  (Patient taking differently: Take 650 mg by mouth as needed.)   albuterol  (VENTOLIN  HFA) 108 (90 Base) MCG/ACT inhaler Inhale 1 puff into the lungs every 6 (six) hours as needed for wheezing or shortness of breath.   glipiZIDE  (GLUCOTROL ) 5 MG tablet Take 1 tablet (5 mg total) by mouth 2 (two) times daily before a meal.   ibuprofen  (ADVIL ,MOTRIN ) 200 MG tablet Take 200 mg by mouth as needed.   Melatonin 5 MG TABS Take 10 mg by mouth as needed.  (Patient taking differently: Take 10 mg by mouth at bedtime.)   metFORMIN  (GLUCOPHAGE ) 1000 MG tablet Take 1 tablet (1,000 mg total) by mouth 2 (two) times daily with a meal.   Semaglutide,0.25 or 0.5MG /DOS, (OZEMPIC, 0.25 OR 0.5 MG/DOSE,) 2 MG/3ML SOPN 0.25 mg SQ weekly x 2 weeks, then increase to 0.5 mg SQ weekly thereafter   [DISCONTINUED] glipiZIDE  (GLUCOTROL ) 5 MG tablet Take 1 tablet (5 mg total) by mouth 2 (two) times daily before a meal.   [DISCONTINUED] metFORMIN   (GLUCOPHAGE ) 1000 MG tablet Take 1 tablet (1,000 mg total) by mouth 2 (two) times daily with a meal.   [DISCONTINUED] metroNIDAZOLE (FLAGYL) 500 MG tablet Take 1 tablet (500 mg total) by mouth 2 (two) times daily.   No facility-administered encounter medications on file as of 01/02/2024.    ALLERGIES: No Known Allergies  VACCINATION STATUS:  There is no immunization history on file for this patient.  Diabetes She presents for her initial diabetic visit. She has type 2 diabetes mellitus. Onset time: diagnosed at age 28 (in March of 2025) Hypoglycemia symptoms include hunger. (nausea) There are no diabetic associated symptoms. Pertinent negatives for diabetes include no blurred vision, no fatigue, no polydipsia and no polyuria. There are no hypoglycemic complications. Symptoms are stable. There are no diabetic complications. Risk factors for coronary artery disease include diabetes mellitus, dyslipidemia, family history and obesity. Current diabetic treatment includes oral agent (dual therapy). She is compliant with treatment most of the time. Her weight is fluctuating minimally. She is following a generally unhealthy diet. When asked about meal planning, she reported none. She has not had a previous visit with a dietitian. She participates in exercise intermittently. (She presents today for her consultation with no meter or logs to review.  She has not been checking glucose routinely, only when she feels off.  She does have prescription for Libre 3 at her pharmacy but it was too expensive for her to use.  She drinks water mostly, will have soda if she goes out to eat.  She only eats 2 meals per day (skipping breakfast) and eats snacks throughout the day.  She engages in routine physical activity by walking.  She is overdue for eye exam, has never seen podiatry in the past.  She has seen the dietician here but notes she is very picky with foods, really does not like many vegetables except for the  starches.) An ACE inhibitor/angiotensin II receptor blocker is not being taken. She does not see a podiatrist.Eye exam is not current.     Review of systems  Constitutional: + Minimally fluctuating body weight, current Body mass index is 46.96 kg/m., no fatigue, no subjective hyperthermia, no subjective hypothermia Eyes: no blurry vision, no xerophthalmia ENT: no sore throat, no nodules palpated in throat, no dysphagia/odynophagia, no hoarseness Cardiovascular: no chest pain, no shortness of breath, no palpitations, no leg swelling Respiratory:  no cough, no shortness of breath Gastrointestinal: no nausea/vomiting/diarrhea Musculoskeletal: no muscle/joint aches Skin: no rashes, no hyperemia Neurological: no tremors, no numbness, no tingling, no dizziness Psychiatric: no depression, no anxiety  Objective:     BP 108/72 (BP Location: Right Arm, Patient Position: Sitting, Cuff Size: Large)   Pulse 90   Ht 5' 5 (1.651 m)   Wt 282 lb 3.2 oz (128 kg)   LMP 11/28/2023 (Exact Date)   BMI 46.96 kg/m   Wt Readings from Last 3 Encounters:  01/02/24 282 lb 3.2 oz (128 kg)  01/01/24 279 lb (126.6 kg)  12/28/23 284 lb (128.8 kg)     BP Readings from Last 3 Encounters:  01/02/24 108/72  01/01/24 123/83  12/28/23 130/79     Physical Exam- Limited  Constitutional:  Body mass index is 46.96 kg/m. , not in acute distress, normal state of mind Eyes:  EOMI, no exophthalmos Neck: Supple Cardiovascular: RRR, no murmurs, rubs, or gallops, no edema Respiratory: Adequate breathing efforts, no crackles, rales, rhonchi, or wheezing Musculoskeletal: no gross deformities, strength intact in all four extremities, no gross restriction of joint movements Skin:  no rashes, no hyperemia Neurological: no tremor with outstretched hands   Diabetic Foot Exam - Simple   No data filed      CMP ( most recent) CMP     Component Value Date/Time   NA 136 06/13/2023 1006   K 4.3 06/13/2023 1006    CL 101 06/13/2023 1006   CO2 18 (L) 06/13/2023 1006   GLUCOSE 328 (H) 06/13/2023 1006   GLUCOSE 82 06/04/2013 1155   BUN 11 06/13/2023 1006   CREATININE 0.45 (L) 06/13/2023 1006   CALCIUM 9.3 06/13/2023 1006   PROT 6.9 06/13/2023 1006   ALBUMIN 4.2 06/13/2023 1006   AST 20 06/13/2023 1006   ALT 41 (H) 06/13/2023 1006   ALKPHOS 96 06/13/2023 1006   BILITOT 0.4 06/13/2023 1006   EGFR 134 06/13/2023 1006   GFRNONAA >90 06/04/2013 1155     Diabetic Labs (most recent): Lab Results  Component Value Date   HGBA1C 11.9 (H) 05/01/2023     Lipid Panel ( most recent) Lipid Panel     Component Value Date/Time   CHOL 186 06/13/2023 1006   TRIG 161 (H) 06/13/2023 1006   HDL 34 (L) 06/13/2023 1006   CHOLHDL 5.5 (H) 06/13/2023 1006   LDLCALC 123 (H) 06/13/2023 1006   LABVLDL 29 06/13/2023 1006      Lab Results  Component Value Date   TSH 2.820 05/01/2023           Assessment & Plan:   1) Type 2 diabetes mellitus with hyperglycemia, without long-term current use of insulin (HCC) (Primary)  She presents today for her consultation with no meter or logs to review.  She has not been checking glucose routinely, only when she feels off.  She does have prescription for Libre 3 at her pharmacy but it was too expensive for her to use.  She drinks water mostly, will have soda if she goes out to eat.  She only eats 2 meals per day (skipping breakfast) and eats snacks throughout the day.  She engages in routine physical activity by walking.  She is overdue for eye exam, has never seen podiatry in the past.  She has seen the dietician here but notes she is very picky with foods, really does not like many vegetables except for the starches.  We did not check A1c today as  she had labs drawn for PCP which included this today (not yet resulted).  - Prescilla HERO Polak has currently uncontrolled symptomatic type 2 DM since 28 years of age, with most recent A1c of 11.3 %.   -Recent labs reviewed.  - I  had a long discussion with her about the progressive nature of diabetes and the pathology behind its complications. -her diabetes is not currently complicated but she remains at a high risk for more acute and chronic complications which include CAD, CVA, CKD, retinopathy, and neuropathy. These are all discussed in detail with her.  The following Lifestyle Medicine recommendations according to American College of Lifestyle Medicine [facility]) were discussed and offered to patient and she agrees to start the journey:  A. Whole Foods, Plant-based plate comprising of fruits and vegetables, plant-based proteins, whole-grain carbohydrates was discussed in detail with the patient.   A list for source of those nutrients were also provided to the patient.  Patient will use only water or unsweetened tea for hydration. B.  The need to stay away from risky substances including alcohol, smoking; obtaining 7 to 9 hours of restorative sleep, at least 150 minutes of moderate intensity exercise weekly, the importance of healthy social connections,  and stress reduction techniques were discussed. C.  A full color page of  Calorie density of various food groups per pound showing examples of each food groups was provided to the patient.  - I have counseled her on diet and weight management by adopting a carbohydrate restricted/protein rich diet. Patient is encouraged to switch to unprocessed or minimally processed complex starch and increased protein intake (animal or plant source), fruits, and vegetables. -  she is advised to stick to a routine mealtimes to eat 3 meals a day and avoid unnecessary snacks (to snack only to correct hypoglycemia).   - she acknowledges that there is a room for improvement in her food and drink choices. - Suggestion is made for her to avoid simple carbohydrates from her diet including Cakes, Sweet Desserts, Ice Cream, Soda (diet and regular), Sweet Tea, Candies, Chips, Cookies, Store Bought Juices,  Alcohol in Excess of 1-2 drinks a day, Artificial Sweeteners, Coffee Creamer, and Sugar-free Products. This will help patient to have more stable blood glucose profile and potentially avoid unintended weight gain.  - she has seen Santana Duke, RDN, CDE for diabetes education.  - I have approached her with the following individualized plan to manage her diabetes and patient agrees:    -she is encouraged to start/continue monitoring glucose 2 times daily, before breakfast and before bed, and to call the clinic if she has readings less than 70 or above 300 for 3 tests in a row.  - Adjustment parameters are given to her for hypo and hyperglycemia in writing. - she is encouraged to call clinic for blood glucose levels less than 70 or above 300 mg /dl. - she is advised to continue Metformin  1000 mg po twice daily and Glipizide  5 mg twice daily, therapeutically suitable for patient .  - she could benefit from incretin therapy.  She was prescribed this by her PCP but was unable to afford the copay.  Will try sending in Ozempic 0.25 mg SQ weekly x 2 weeks, then increase to 0.5 mg SQ weekly thereafter if tolerated well.  Perhaps this medication will have better coverage with her insurance.  She does not have personal or family hx of MTC, pancreatitis, nor does she smoke making her an excellent candidate.  -  Specific targets for  A1c; LDL, HDL, and Triglycerides were discussed with the patient.  2) Blood Pressure /Hypertension:  her blood pressure is controlled to target without the use of antihypertensive medications.  If BP elevated on 3 separate occasions, will consider adding ACE/ARB due to renal protection properties.     3) Lipids/Hyperlipidemia:    Review of her recent lipid panel from 06/13/23 showed uncontrolled LDL at 123 and elevated triglycerides of 161.  She is not on any lipid lowering medications at this time.   4)  Weight/Diet:  her Body mass index is 46.96 kg/m.  -  clearly  complicating her diabetes care.   she is a candidate for weight loss. I discussed with her the fact that loss of 5 - 10% of her  current body weight will have the most impact on her diabetes management.  Exercise, and detailed carbohydrates information provided  -  detailed on discharge instructions.  5) Chronic Care/Health Maintenance: -she is not on ACEI/ARB or Statin medications and is encouraged to initiate and continue to follow up with Ophthalmology, Dentist, Podiatrist at least yearly or according to recommendations, and advised to stay away from smoking. I have recommended yearly flu vaccine and pneumonia vaccine at least every 5 years; moderate intensity exercise for up to 150 minutes weekly; and sleep for at least 7 hours a day.  - she is advised to maintain close follow up with Grooms, [place]arm[place], PA-C for primary care needs, as well as her other providers for optimal and coordinated care.   - Time spent in this patient care: 60 min, which was spent in counseling her about her diabetes and the rest reviewing her blood glucose logs, discussing her hypoglycemia and hyperglycemia episodes, reviewing her current and previous labs/studies (including abstraction from other facilities) and medications doses and developing a long term treatment plan based on the latest standards of care/guidelines; and documenting her care.    Please refer to Patient Instructions for Blood Glucose Monitoring and Insulin/Medications Dosing Guide in media tab for additional information. Please also refer to Patient Self Inventory in the Media tab for reviewed elements of pertinent patient history.  Prescilla CHRISTELLA Side participated in the discussions, expressed understanding, and voiced agreement with the above plans.  All questions were answered to her satisfaction. she is encouraged to contact clinic should she have any questions or concerns prior to her return visit.     Follow up plan: - Return in about 3 months  (around 04/03/2024) for Diabetes F/U with A1c in office, No previsit labs, Bring meter and logs.    Benton Rio, [facility] [address] [place], [place] [address] Phone: [phone] Fax: [phone]  01/02/2024, 4:13 PM

## 2024-01-03 LAB — COMPREHENSIVE METABOLIC PANEL WITH GFR
AG Ratio: 1.5 (calc) (ref 1.0–2.5)
ALT: 41 U/L — ABNORMAL HIGH (ref 6–29)
AST: 25 U/L (ref 10–30)
Albumin: 4.4 g/dL (ref 3.6–5.1)
Alkaline phosphatase (APISO): 83 U/L (ref 31–125)
BUN: 11 mg/dL (ref 7–25)
CO2: 23 mmol/L (ref 20–32)
Calcium: 9.6 mg/dL (ref 8.6–10.2)
Chloride: 101 mmol/L (ref 98–110)
Creat: 0.65 mg/dL (ref 0.50–0.96)
Globulin: 2.9 g/dL (ref 1.9–3.7)
Glucose, Bld: 450 mg/dL — ABNORMAL HIGH (ref 65–139)
Potassium: 4.5 mmol/L (ref 3.5–5.3)
Sodium: 135 mmol/L (ref 135–146)
Total Bilirubin: 0.3 mg/dL (ref 0.2–1.2)
Total Protein: 7.3 g/dL (ref 6.1–8.1)
eGFR: 122 mL/min/1.73m2 (ref >=60)

## 2024-01-03 LAB — CBC WITH DIFFERENTIAL/PLATELET
Absolute Lymphocytes: 2371 {cells}/uL (ref 850–3900)
Absolute Monocytes: 456 {cells}/uL (ref 200–950)
Basophils Absolute: 103 {cells}/uL (ref 0–200)
Basophils Relative: 0.9 %
Eosinophils Absolute: 182 {cells}/uL (ref 15–500)
Eosinophils Relative: 1.6 %
HCT: 45 % (ref 35.0–45.0)
Hemoglobin: 14.3 g/dL (ref 11.7–15.5)
MCH: 26 pg — ABNORMAL LOW (ref 27.0–33.0)
MCHC: 31.8 g/dL — ABNORMAL LOW (ref 32.0–36.0)
MCV: 81.7 fL (ref 80.0–100.0)
MPV: 12.8 fL — ABNORMAL HIGH (ref 7.5–12.5)
Monocytes Relative: 4 %
Neutro Abs: 8288 {cells}/uL — ABNORMAL HIGH (ref 1500–7800)
Neutrophils Relative %: 72.7 %
Platelets: 395 Thousand/uL (ref 140–400)
RBC: 5.51 Million/uL — ABNORMAL HIGH (ref 3.80–5.10)
RDW: 13.3 % (ref 11.0–15.0)
Total Lymphocyte: 20.8 %
WBC: 11.4 Thousand/uL — ABNORMAL HIGH (ref 3.8–10.8)

## 2024-01-03 LAB — HEMOGLOBIN A1C
Hgb A1c MFr Bld: 12.2 % — ABNORMAL HIGH (ref <5.7)
Mean Plasma Glucose: 303 mg/dL
eAG (mmol/L): 16.8 mmol/L

## 2024-01-03 LAB — MICROALBUMIN / CREATININE URINE RATIO
Creatinine, Urine: 31 mg/dL (ref 20–275)
Microalb Creat Ratio: 10 mg/g{creat} (ref <30)
Microalb, Ur: 0.3 mg/dL

## 2024-01-04 ENCOUNTER — Ambulatory Visit: Payer: Self-pay | Admitting: Physician Assistant

## 2024-04-09 ENCOUNTER — Ambulatory Visit: Payer: PRIVATE HEALTH INSURANCE | Admitting: Nurse Practitioner

## 2024-07-01 ENCOUNTER — Ambulatory Visit: Payer: PRIVATE HEALTH INSURANCE | Admitting: Physician Assistant
